# Patient Record
Sex: Female | Born: 1970 | Race: White | Hispanic: No | Marital: Married | Smoking: Never smoker
Health system: Southern US, Community
[De-identification: ages and names within clinical notes are randomized; demographics above are authoritative.]

## PROBLEM LIST (undated history)

## (undated) DIAGNOSIS — D1800 Hemangioma unspecified site: Secondary | ICD-10-CM

## (undated) HISTORY — PX: CHOLECYSTECTOMY: SHX55

## (undated) HISTORY — PX: ADENOIDECTOMY: SUR15

---

## 2009-04-05 ENCOUNTER — Encounter (INDEPENDENT_AMBULATORY_CARE_PROVIDER_SITE_OTHER): Payer: Self-pay | Admitting: Diagnostic Radiology

## 2009-04-05 ENCOUNTER — Ambulatory Visit (HOSPITAL_COMMUNITY): Admission: RE | Admit: 2009-04-05 | Discharge: 2009-04-05 | Payer: Self-pay | Admitting: Internal Medicine

## 2010-07-20 ENCOUNTER — Encounter (INDEPENDENT_AMBULATORY_CARE_PROVIDER_SITE_OTHER): Payer: Self-pay | Admitting: Internal Medicine

## 2010-10-02 LAB — APTT: aPTT: 27 seconds (ref 24–37)

## 2010-10-02 LAB — CBC
HCT: 41.5 % (ref 36.0–46.0)
MCHC: 34.1 g/dL (ref 30.0–36.0)
Platelets: 248 10*3/uL (ref 150–400)
RBC: 4.56 MIL/uL (ref 3.87–5.11)

## 2010-10-02 LAB — PROTIME-INR
INR: 1.04 (ref 0.00–1.49)
Prothrombin Time: 13.5 seconds (ref 11.6–15.2)

## 2013-03-15 ENCOUNTER — Encounter: Payer: Self-pay | Admitting: Family Medicine

## 2013-03-15 ENCOUNTER — Ambulatory Visit (INDEPENDENT_AMBULATORY_CARE_PROVIDER_SITE_OTHER): Payer: BC Managed Care – PPO | Admitting: Family Medicine

## 2013-03-15 VITALS — BP 130/80 | Ht 66.0 in | Wt 251.5 lb

## 2013-03-15 DIAGNOSIS — Z87898 Personal history of other specified conditions: Secondary | ICD-10-CM

## 2013-03-15 DIAGNOSIS — Z0000 Encounter for general adult medical examination without abnormal findings: Secondary | ICD-10-CM

## 2013-03-15 DIAGNOSIS — I10 Essential (primary) hypertension: Secondary | ICD-10-CM

## 2013-03-15 DIAGNOSIS — Z79899 Other long term (current) drug therapy: Secondary | ICD-10-CM

## 2013-03-15 DIAGNOSIS — Z8742 Personal history of other diseases of the female genital tract: Secondary | ICD-10-CM

## 2013-03-15 MED ORDER — ENALAPRIL MALEATE 10 MG PO TABS: 10.0000 mg | ORAL_TABLET | Freq: Every day | ORAL | Status: DC

## 2013-03-15 MED ORDER — NORGESTREL-ETHINYL ESTRADIOL 0.3-30 MG-MCG PO TABS: 1.0000 | ORAL_TABLET | Freq: Every day | ORAL | Status: DC

## 2013-03-15 NOTE — Progress Notes (Signed)
  Subjective:    Patient ID: Dana Hardy, female    DOB: 01-09-1971, 42 y.o.   MRN: [id number]  HPI Patient is here today for her annual wellness exam. She states she has no concerns about her health and is doing very well.   Compliant with bp meds regularly  Walking was great but not so much now, due toheat  Generally not a flu shot person.  No fam hx of colon cancer.   last mammo--never has had one as of yet.   Review of Systems  Constitutional: Negative for activity change, appetite change and fatigue.  HENT: Negative for congestion, rhinorrhea, neck pain and ear discharge.   Eyes: Negative for discharge.  Respiratory: Negative for cough, chest tightness and wheezing.   Cardiovascular: Negative for chest pain.  Gastrointestinal: Negative for vomiting and abdominal pain.  Genitourinary: Negative for frequency and difficulty urinating.  Allergic/Immunologic: Negative for environmental allergies and food allergies.  Neurological: Negative for weakness and headaches.  Psychiatric/Behavioral: Negative for behavioral problems and agitation.       Objective:   Physical Exam  Vitals reviewed. Constitutional: She is oriented to person, place, and time. She appears well-developed and well-nourished.  Of obesity noted  HENT:  Head: Normocephalic.  Right Ear: External ear normal.  Left Ear: External ear normal.  Eyes: Pupils are equal, round, and reactive to light.  Neck: Normal range of motion. No thyromegaly present.  Cardiovascular: Normal rate, regular rhythm, normal heart sounds and intact distal pulses.   No murmur heard. Pulmonary/Chest: Effort normal and breath sounds normal. No respiratory distress. She has no wheezes.  Abdominal: Soft. Bowel sounds are normal. She exhibits no distension and no mass. There is no tenderness.  Musculoskeletal: Normal range of motion. She exhibits no edema and no tenderness.  Lymphadenopathy:    She has no cervical adenopathy.   Neurological: She is alert and oriented to person, place, and time. She exhibits normal muscle tone.  Skin: Skin is warm and dry.  Psychiatric: She has a normal mood and affect. Her behavior is normal.          Assessment & Plan:  Impression wellness exam #2 hypertension good control plan mammogram. Appropriate blood work. Maintain same medications. Maintain same birth control pills. Diet exercise discussed. Check in 6 months. WSL

## 2013-03-16 DIAGNOSIS — I10 Essential (primary) hypertension: Secondary | ICD-10-CM | POA: Insufficient documentation

## 2013-03-16 DIAGNOSIS — Z87898 Personal history of other specified conditions: Secondary | ICD-10-CM | POA: Insufficient documentation

## 2013-03-18 LAB — LIPID PANEL: VLDL: 26 mg/dL (ref 0–40)

## 2013-03-18 LAB — HEPATIC FUNCTION PANEL
AST: 16 U/L (ref 0–37)
Alkaline Phosphatase: 266 U/L — ABNORMAL HIGH (ref 39–117)
Indirect Bilirubin: 0.4 mg/dL (ref 0.0–0.9)
Total Protein: 7 g/dL (ref 6.0–8.3)

## 2013-03-18 LAB — BASIC METABOLIC PANEL
BUN: 12 mg/dL (ref 6–23)
CO2: 27 mEq/L (ref 19–32)
Creat: 0.97 mg/dL (ref 0.50–1.10)
Potassium: 4.2 mEq/L (ref 3.5–5.3)

## 2013-03-20 ENCOUNTER — Other Ambulatory Visit: Payer: Self-pay | Admitting: Family Medicine

## 2013-03-21 ENCOUNTER — Encounter: Payer: Self-pay | Admitting: Family Medicine

## 2013-03-22 ENCOUNTER — Ambulatory Visit (HOSPITAL_COMMUNITY): Payer: Self-pay

## 2013-12-20 ENCOUNTER — Ambulatory Visit (INDEPENDENT_AMBULATORY_CARE_PROVIDER_SITE_OTHER): Payer: PRIVATE HEALTH INSURANCE | Admitting: Family Medicine

## 2013-12-20 ENCOUNTER — Encounter: Payer: Self-pay | Admitting: Family Medicine

## 2013-12-20 VITALS — BP 140/88 | Temp 98.2°F | Ht 66.0 in | Wt 256.5 lb

## 2013-12-20 DIAGNOSIS — I10 Essential (primary) hypertension: Secondary | ICD-10-CM

## 2013-12-20 MED ORDER — ENALAPRIL MALEATE 10 MG PO TABS: 10.0000 mg | ORAL_TABLET | Freq: Every day | ORAL | Status: DC

## 2013-12-20 NOTE — Progress Notes (Signed)
   Subjective:    Patient ID: Dana LouisHeather Salome, female    DOB: [birth year], 43 y.o.   MRN: [id number]  Hypertension This is a chronic problem. The current episode started more than 1 year ago. The problem has been gradually improving since onset. The problem is controlled. There are no associated agents to hypertension. There are no known risk factors for coronary artery disease. Treatments tried: enalapril. The current treatment provides significant improvement. Compliance problems: Patient has been off of med for awhile due to not having insurance.     Patient states she has no concerns at this time.   Sticking with bp meds/however recently ran out  Usually in the morn/   Trying to watch salt in the diet. Watching fats in the diet. Review of Systems No headache no chest pain no back pain no abdominal pain no change in bowel habits no blood in stool ROS otherwise negative.    Objective:   Physical Exam Alert no apparent distress. HEENT normal. Vital stable. BP 142/90. Lungs clear. Heart regular in rhythm.       Assessment & Plan:  Impression 1 hypertension suboptimal in currently with having run out of medication. Plan diet discussed. Exercise discussed. Compliance discussed. Medications refilled. Check in 6 months. WSL

## 2014-04-09 ENCOUNTER — Other Ambulatory Visit: Payer: Self-pay | Admitting: Family Medicine

## 2014-06-16 ENCOUNTER — Other Ambulatory Visit: Payer: Self-pay | Admitting: Family Medicine

## 2014-09-27 ENCOUNTER — Other Ambulatory Visit: Payer: Self-pay | Admitting: Family Medicine

## 2014-11-07 ENCOUNTER — Ambulatory Visit (INDEPENDENT_AMBULATORY_CARE_PROVIDER_SITE_OTHER): Payer: 59 | Admitting: Family Medicine

## 2014-11-07 ENCOUNTER — Encounter: Payer: Self-pay | Admitting: Family Medicine

## 2014-11-07 VITALS — BP 148/94 | Ht 66.0 in | Wt 248.0 lb

## 2014-11-07 DIAGNOSIS — I10 Essential (primary) hypertension: Secondary | ICD-10-CM | POA: Diagnosis not present

## 2014-11-07 DIAGNOSIS — Z79899 Other long term (current) drug therapy: Secondary | ICD-10-CM | POA: Diagnosis not present

## 2014-11-07 MED ORDER — ENALAPRIL MALEATE 20 MG PO TABS: 20.0000 mg | ORAL_TABLET | Freq: Every day | ORAL | Status: DC

## 2014-11-07 NOTE — Progress Notes (Signed)
   Subjective:    Patient ID: Dana Hardy, female    DOB: [birth year], 44 y.o.   MRN: [id number]  Hypertension This is a chronic problem. The current episode started more than 1 year ago. The problem has been gradually improving since onset. There are no associated agents to hypertension. There are no known risk factors for coronary artery disease. Treatments tried: Vasotec. The current treatment provides moderate improvement. There are no compliance problems.    Cough achey felt poorly. Slowly improving.   Works a lot physically a child but no regular exercise. Next  Mostly good compliance with diet.   Good compliance with medications      Review of Systems  no headache no chest pain no back pain no abdominal pain    Objective:   Physical Exam  alert mild malaise H&T mom his congestion. Normal lungs slight bronchial cough heart rare rhythm blood pressure  Still elevated on repeat       Assessment & Plan:   impression 1 hypertension unsatisfactory control plan increase enalapril to 20 mg daily. Diet exercise discussed. Strongly encouraged screening physical the summer appropriate blood work. WSL

## 2014-11-09 ENCOUNTER — Other Ambulatory Visit: Payer: Self-pay | Admitting: Family Medicine

## 2014-11-11 ENCOUNTER — Encounter: Payer: Self-pay | Admitting: Family Medicine

## 2014-11-11 LAB — HEPATIC FUNCTION PANEL
ALBUMIN: 3.8 g/dL (ref 3.5–5.5)
ALK PHOS: 358 IU/L — AB (ref 39–117)
ALT: 30 IU/L (ref 0–32)
AST: 24 IU/L (ref 0–40)
Bilirubin Total: 0.3 mg/dL (ref 0.0–1.2)
Bilirubin, Direct: 0.11 mg/dL (ref 0.00–0.40)
TOTAL PROTEIN: 6.9 g/dL (ref 6.0–8.5)

## 2014-11-11 LAB — LIPID PANEL
CHOL/HDL RATIO: 3.6 ratio (ref 0.0–4.4)
CHOLESTEROL TOTAL: 178 mg/dL (ref 100–199)
HDL: 49 mg/dL (ref >39)
LDL CALC: 91 mg/dL (ref 0–99)
Triglycerides: 190 mg/dL — ABNORMAL HIGH (ref 0–149)
VLDL CHOLESTEROL CAL: 38 mg/dL (ref 5–40)

## 2014-11-11 LAB — BASIC METABOLIC PANEL
BUN / CREAT RATIO: 16 (ref 9–23)
BUN: 14 mg/dL (ref 6–24)
CALCIUM: 8.8 mg/dL (ref 8.7–10.2)
CHLORIDE: 98 mmol/L (ref 97–108)
CO2: 24 mmol/L (ref 18–29)
Creatinine, Ser: 0.9 mg/dL (ref 0.57–1.00)
GFR, EST AFRICAN AMERICAN: 91 mL/min/{1.73_m2} (ref >59)
GFR, EST NON AFRICAN AMERICAN: 79 mL/min/{1.73_m2} (ref >59)
GLUCOSE: 79 mg/dL (ref 65–99)
POTASSIUM: 4.3 mmol/L (ref 3.5–5.2)
Sodium: 139 mmol/L (ref 134–144)

## 2014-12-12 ENCOUNTER — Ambulatory Visit (INDEPENDENT_AMBULATORY_CARE_PROVIDER_SITE_OTHER): Payer: 59 | Admitting: Nurse Practitioner

## 2014-12-12 ENCOUNTER — Encounter: Payer: Self-pay | Admitting: Nurse Practitioner

## 2014-12-12 VITALS — BP 130/90 | Ht 67.0 in | Wt 254.0 lb

## 2014-12-12 DIAGNOSIS — Z1231 Encounter for screening mammogram for malignant neoplasm of breast: Secondary | ICD-10-CM | POA: Diagnosis not present

## 2014-12-12 DIAGNOSIS — Z0000 Encounter for general adult medical examination without abnormal findings: Secondary | ICD-10-CM

## 2014-12-12 DIAGNOSIS — Z01419 Encounter for gynecological examination (general) (routine) without abnormal findings: Secondary | ICD-10-CM

## 2014-12-12 DIAGNOSIS — L578 Other skin changes due to chronic exposure to nonionizing radiation: Secondary | ICD-10-CM

## 2014-12-14 ENCOUNTER — Encounter: Payer: Self-pay | Admitting: Nurse Practitioner

## 2014-12-14 DIAGNOSIS — L578 Other skin changes due to chronic exposure to nonionizing radiation: Secondary | ICD-10-CM | POA: Insufficient documentation

## 2014-12-14 NOTE — Progress Notes (Signed)
   Subjective:    Patient ID: Cathren Vandoorn, female    DOB: Jan 22, 1971, 44 y.o.   MRN: [id number]  HPI presents for her wellness physical. Denies any missed pills. Regular menses, normal flow. Same sexual partner. Regular vision exams. Needs dental exam. Very active lifestyle. Overall healthy diet. Routine lab work has been done see note 11/10/2014.    Review of Systems  Constitutional: Negative for fever, activity change, appetite change and fatigue.  HENT: Positive for dental problem. Negative for ear pain, sinus pressure and sore throat.   Respiratory: Negative for cough, chest tightness, shortness of breath and wheezing.   Cardiovascular: Negative for chest pain.  Gastrointestinal: Negative for nausea, vomiting, abdominal pain, diarrhea, constipation and abdominal distention.  Genitourinary: Negative for dysuria, urgency, frequency, vaginal discharge, enuresis, difficulty urinating, genital sores, menstrual problem and pelvic pain.       Objective:   Physical Exam  Constitutional: She is oriented to person, place, and time. She appears well-developed. No distress.  HENT:  Right Ear: External ear normal.  Left Ear: External ear normal.  Mouth/Throat: Oropharynx is clear and moist.  Neck: Normal range of motion. Neck supple. No tracheal deviation present. No thyromegaly present.  Cardiovascular: Normal rate, regular rhythm and normal heart sounds.  Exam reveals no gallop.   No murmur heard. Pulmonary/Chest: Effort normal and breath sounds normal.  Abdominal: Soft. She exhibits no distension. There is no tenderness.  Genitourinary: Vagina normal and uterus normal. No vaginal discharge found.  External GU no rashes or lesions. Vagina no discharge. Cervix normal limit in appearance, no CMT. Bimanual exam no tenderness or obvious masses.  Musculoskeletal: She exhibits no edema.  Lymphadenopathy:    She has no cervical adenopathy.  Neurological: She is alert and oriented to person,  place, and time.  Skin: Skin is warm and dry. No rash noted.  Significant sun damage noted on sun exposed areas.  Psychiatric: She has a normal mood and affect. Her behavior is normal.  Vitals reviewed.  Breast exam: Areas of density, minimal fine nodularity, no masses. Axilla no adenopathy.        Assessment & Plan:   Problem List Items Addressed This Visit      Musculoskeletal and Integument   Sun-damaged skin    Other Visit Diagnoses    Well woman exam    -  Primary    Relevant Orders    MM DIGITAL SCREENING BILATERAL    Visit for screening mammogram        Relevant Orders    MM DIGITAL SCREENING BILATERAL      Recommend continued weight loss efforts. Daily vitamin D and calcium supplementation. Patient has seen a dermatologist in the past for skin cancer screening and removal of several lesions. Strongly recommend she reconsider a yearly skin cancer screening. Also wear sunscreen on a regular basis. Wishes to continue birth control pills for now. Nonsmoker. Return in about 1 year (around 12/12/2015) for physical.

## 2014-12-20 ENCOUNTER — Ambulatory Visit (HOSPITAL_COMMUNITY)
Admission: RE | Admit: 2014-12-20 | Discharge: 2014-12-20 | Disposition: A | Discharge status: Home / Self Care | Payer: 59 | Source: Ambulatory Visit | Attending: Nurse Practitioner | Admitting: Nurse Practitioner

## 2014-12-20 ENCOUNTER — Ambulatory Visit (HOSPITAL_COMMUNITY): Admission: RE | Admit: 2014-12-20 | Payer: 59 | Source: Ambulatory Visit

## 2014-12-20 ENCOUNTER — Other Ambulatory Visit: Payer: Self-pay | Admitting: Nurse Practitioner

## 2014-12-20 DIAGNOSIS — Z1231 Encounter for screening mammogram for malignant neoplasm of breast: Secondary | ICD-10-CM

## 2014-12-31 ENCOUNTER — Other Ambulatory Visit: Payer: Self-pay | Admitting: Family Medicine

## 2015-02-04 ENCOUNTER — Telehealth: Payer: Self-pay | Admitting: Family Medicine

## 2015-02-04 ENCOUNTER — Other Ambulatory Visit: Payer: Self-pay | Admitting: Family Medicine

## 2015-02-04 MED ORDER — NORGESTREL-ETHINYL ESTRADIOL 0.3-30 MG-MCG PO TABS: 1.0000 | ORAL_TABLET | Freq: Every day | ORAL | Status: DC

## 2015-02-04 NOTE — Telephone Encounter (Signed)
Notified patient's husband that med was sent to pharmacy.

## 2015-02-04 NOTE — Telephone Encounter (Signed)
Pt is needing a refill on her birth control meds.     [facility]

## 2015-04-27 ENCOUNTER — Other Ambulatory Visit: Payer: Self-pay | Admitting: Family Medicine

## 2015-05-08 ENCOUNTER — Encounter: Payer: Self-pay | Admitting: Family Medicine

## 2015-05-08 ENCOUNTER — Ambulatory Visit (INDEPENDENT_AMBULATORY_CARE_PROVIDER_SITE_OTHER): Payer: 59 | Admitting: Family Medicine

## 2015-05-08 VITALS — BP 136/88 | Ht 66.0 in | Wt 255.4 lb

## 2015-05-08 DIAGNOSIS — I10 Essential (primary) hypertension: Secondary | ICD-10-CM | POA: Diagnosis not present

## 2015-05-08 MED ORDER — ENALAPRIL MALEATE 20 MG PO TABS: 20.0000 mg | ORAL_TABLET | Freq: Every day | ORAL | Status: DC

## 2015-05-08 NOTE — Progress Notes (Signed)
   Subjective:    Patient ID: Robbie LouisHeather Eisenhart, female    DOB: 08/17/1970, 44 y.o.   MRN: [id number]  Hypertension This is a chronic problem. The current episode started more than 1 year ago. There are no compliance problems.      Sticking with bp med faithfully, feeling better over all   Staying physically active  Working pretty  Wide open  Patient states no other concerns this visit.  Review of Systems No headache no chest pain no back pain no abdominal pain ROS otherwise negative    Objective:   Physical Exam Alert vitals stable HEENT normal lungs clear heart regular in rhythm blood pressure good on repeat       Assessment & Plan:  Impression hypertension good control, medications reviewed plan maintain same meds diet exercise discussed recheck in 6 months WSL

## 2015-05-24 ENCOUNTER — Emergency Department (HOSPITAL_BASED_OUTPATIENT_CLINIC_OR_DEPARTMENT_OTHER)
Admission: EM | Admit: 2015-05-24 | Discharge: 2015-05-24 | Disposition: A | Discharge status: Home / Self Care | Payer: 59 | Attending: Emergency Medicine | Admitting: Emergency Medicine

## 2015-05-24 ENCOUNTER — Encounter (HOSPITAL_BASED_OUTPATIENT_CLINIC_OR_DEPARTMENT_OTHER): Payer: Self-pay

## 2015-05-24 DIAGNOSIS — N898 Other specified noninflammatory disorders of vagina: Secondary | ICD-10-CM | POA: Insufficient documentation

## 2015-05-24 DIAGNOSIS — Z3202 Encounter for pregnancy test, result negative: Secondary | ICD-10-CM | POA: Insufficient documentation

## 2015-05-24 DIAGNOSIS — Z79899 Other long term (current) drug therapy: Secondary | ICD-10-CM | POA: Diagnosis not present

## 2015-05-24 DIAGNOSIS — R1031 Right lower quadrant pain: Secondary | ICD-10-CM | POA: Insufficient documentation

## 2015-05-24 DIAGNOSIS — Z792 Long term (current) use of antibiotics: Secondary | ICD-10-CM | POA: Insufficient documentation

## 2015-05-24 DIAGNOSIS — R109 Unspecified abdominal pain: Secondary | ICD-10-CM

## 2015-05-24 LAB — COMPREHENSIVE METABOLIC PANEL
ALBUMIN: 3.5 g/dL (ref 3.5–5.0)
ALT: 25 U/L (ref 14–54)
ANION GAP: 9 (ref 5–15)
AST: 26 U/L (ref 15–41)
Alkaline Phosphatase: 315 U/L — ABNORMAL HIGH (ref 38–126)
BUN: 13 mg/dL (ref 6–20)
CO2: 27 mmol/L (ref 22–32)
Calcium: 9 mg/dL (ref 8.9–10.3)
Chloride: 102 mmol/L (ref 101–111)
Creatinine, Ser: 0.84 mg/dL (ref 0.44–1.00)
GFR calc non Af Amer: >60 mL/min (ref >60)
GLUCOSE: 126 mg/dL — AB (ref 65–99)
POTASSIUM: 3.7 mmol/L (ref 3.5–5.1)
SODIUM: 138 mmol/L (ref 135–145)
Total Bilirubin: 0.4 mg/dL (ref 0.3–1.2)
Total Protein: 7.5 g/dL (ref 6.5–8.1)

## 2015-05-24 LAB — URINALYSIS, ROUTINE W REFLEX MICROSCOPIC
BILIRUBIN URINE: NEGATIVE
GLUCOSE, UA: NEGATIVE mg/dL
KETONES UR: NEGATIVE mg/dL
LEUKOCYTES UA: NEGATIVE
Nitrite: NEGATIVE
PROTEIN: 30 mg/dL — AB
Specific Gravity, Urine: 1.022 (ref 1.005–1.030)
pH: 7.5 (ref 5.0–8.0)

## 2015-05-24 LAB — WET PREP, GENITAL
Clue Cells Wet Prep HPF POC: NONE SEEN
SPERM: NONE SEEN
Trich, Wet Prep: NONE SEEN
YEAST WET PREP: NONE SEEN

## 2015-05-24 LAB — CBC WITH DIFFERENTIAL/PLATELET
BASOS PCT: 1 %
Basophils Absolute: 0 10*3/uL (ref 0.0–0.1)
EOS ABS: 0 10*3/uL (ref 0.0–0.7)
EOS PCT: 1 %
HCT: 42.2 % (ref 36.0–46.0)
HEMOGLOBIN: 13.9 g/dL (ref 12.0–15.0)
Lymphocytes Relative: 15 %
Lymphs Abs: 1 10*3/uL (ref 0.7–4.0)
MCH: 29.6 pg (ref 26.0–34.0)
MCHC: 32.9 g/dL (ref 30.0–36.0)
MCV: 90 fL (ref 78.0–100.0)
MONO ABS: 0.4 10*3/uL (ref 0.1–1.0)
MONOS PCT: 6 %
NEUTROS PCT: 77 %
Neutro Abs: 5.1 10*3/uL (ref 1.7–7.7)
PLATELETS: 249 10*3/uL (ref 150–400)
RBC: 4.69 MIL/uL (ref 3.87–5.11)
RDW: 13 % (ref 11.5–15.5)
WBC: 6.6 10*3/uL (ref 4.0–10.5)

## 2015-05-24 LAB — URINE MICROSCOPIC-ADD ON: WBC, UA: NONE SEEN WBC/hpf (ref 0–5)

## 2015-05-24 LAB — PREGNANCY, URINE: Preg Test, Ur: NEGATIVE

## 2015-05-24 NOTE — ED Provider Notes (Signed)
CSN: [id number]     Arrival date & time 05/24/15  1204 History   First MD Initiated Contact with Patient 05/24/15 1230     Chief Complaint  Patient presents with  . Abdominal Pain     (Consider location/radiation/quality/duration/timing/severity/associated sxs/prior Treatment) HPI Comments: Patient presents with acute onset of right lower quadrant abdominal pain starting approximately one hour prior to arrival. Patient describes the pain as cramping and severe. It has gradually improved since onset. Pain is gone from a 10/10 to a 2/10. No associated vomiting, fever, diarrhea. Pain did not radiate. She has not had pain like this in the past. Patient notes that she started her menstrual period this morning. No history of ovarian problems. Only abdominal surgery was cholecystectomy. No history of kidney stones. No urinary symptoms including dysuria or hematuria. Kidney stones to monitor family however. Onset of symptoms acute. Course is improving. Nothing makes symptoms better or worse. No treatments prior to arrival.  Patient is a 44 y.o. female presenting with abdominal pain. The history is provided by the patient.  Abdominal Pain Associated symptoms: vaginal bleeding   Associated symptoms: no chest pain, no cough, no diarrhea, no dysuria, no fever, no hematuria, no nausea, no sore throat, no vaginal discharge and no vomiting     History reviewed. No pertinent past medical history. History reviewed. No pertinent past surgical history. No family history on file. Social History  Substance Use Topics  . Smoking status: Never Smoker   . Smokeless tobacco: None  . Alcohol Use: No   OB History    No data available     Review of Systems  Constitutional: Negative for fever.  HENT: Negative for rhinorrhea and sore throat.   Eyes: Negative for redness.  Respiratory: Negative for cough.   Cardiovascular: Negative for chest pain.  Gastrointestinal: Positive for abdominal pain. Negative for  nausea, vomiting and diarrhea.  Genitourinary: Positive for vaginal bleeding. Negative for dysuria, urgency, hematuria and vaginal discharge.  Musculoskeletal: Negative for myalgias.  Skin: Negative for rash.  Neurological: Negative for headaches.    Allergies  Review of patient's allergies indicates no known allergies.  Home Medications   Prior to Admission medications   Medication Sig Start Date End Date Taking? Authorizing Provider  enalapril (VASOTEC) 20 MG tablet Take 1 tablet (20 mg total) by mouth daily. 05/08/15   Merlyn AlbertWilliam S Luking, MD  norgestrel-ethinyl estradiol (CRYSELLE-28) 0.3-30 MG-MCG tablet Take 1 tablet by mouth daily. 02/04/15   Merlyn AlbertWilliam S Luking, MD   BP 159/97 mmHg  Pulse 63  Temp(Src) 97.6 F (36.4 C) (Oral)  Resp 20  Ht 5\' 7"  (1.702 m)  Wt 113.399 kg  BMI 39.15 kg/m2  LMP 05/24/2015   Physical Exam  Constitutional: She appears well-developed and well-nourished.  HENT:  Head: Normocephalic and atraumatic.  Eyes: Conjunctivae are normal. Right eye exhibits no discharge. Left eye exhibits no discharge.  Neck: Normal range of motion. Neck supple.  Cardiovascular: Normal rate, regular rhythm and normal heart sounds.   Pulmonary/Chest: Effort normal and breath sounds normal.  Abdominal: Soft. Bowel sounds are normal. There is tenderness (mild to moderate tenderness right lateral abdomen and right lower abdomen.). There is no rebound and no guarding.  Genitourinary: There is no rash or tenderness on the right labia. There is no rash or tenderness on the left labia. Uterus is not tender. Cervix exhibits discharge (bleeding). Cervix exhibits no motion tenderness. Right adnexum displays no mass and no tenderness. Left adnexum displays no mass and  no tenderness. There is bleeding in the vagina. No vaginal discharge found.  No adnexal tenderness  Neurological: She is alert.  Skin: Skin is warm and dry.  Psychiatric: She has a normal mood and affect.  Nursing note and  vitals reviewed.   ED Course  Procedures (including critical care time) Labs Review Labs Reviewed  WET PREP, GENITAL - Abnormal; Notable for the following:    WBC, Wet Prep HPF POC MANY (*)    All other components within normal limits  URINALYSIS, ROUTINE W REFLEX MICROSCOPIC (NOT AT [facility]) - Abnormal; Notable for the following:    Hgb urine dipstick MODERATE (*)    Protein, ur 30 (*)    All other components within normal limits  COMPREHENSIVE METABOLIC PANEL - Abnormal; Notable for the following:    Glucose, Bld 126 (*)    Alkaline Phosphatase 315 (*)    All other components within normal limits  URINE MICROSCOPIC-ADD ON - Abnormal; Notable for the following:    Squamous Epithelial / LPF 0-5 (*)    Bacteria, UA RARE (*)    All other components within normal limits  PREGNANCY, URINE  CBC WITH DIFFERENTIAL/PLATELET  GC/CHLAMYDIA PROBE AMP ([place]) NOT AT [facility]    Imaging Review No results found. I have personally reviewed and evaluated these images and lab results as part of my medical decision-making.   EKG Interpretation None       12:42 PM Patient seen and examined. Work-up initiated. Medications ordered.   Vital signs reviewed and are as follows: BP 159/97 mmHg  Pulse 63  Temp(Src) 97.6 F (36.4 C) (Oral)  Resp 20  Ht  (1.702 m)  Wt 113.399 kg  BMI 39.15 kg/m2  LMP 05/24/2015  Pelvic exam performed with nurse chaperone Camelia Eng).   2:09 PM patient updated on results including elevated alkaline phosphatase admits to be monitored.  Offered CT versus observation given that symptoms are improved in that labs are reassuring. Patient request discharge to home. We discussed that she needs to seek immediate medical attention with return if severe pain, fever, vomiting, blood in stool, or other concerns. Patient verbalizes understanding and agrees with this plan. Encouraged PCP follow-up in next several days for recheck. Patient given strainer, as kidney stone is  on differential.  MDM   Final diagnoses:  Abdominal pain, unspecified abdominal location   Patient with acute onset of right lower quadrant pain, severe, followed by gradual improvement and near resolution of symptoms. No other symptoms reported. Patient does have some mild tenderness. Labs are reassuring. On pelvic exam, no significant pelvic pain to necessitate pelvic ultrasound. Urine has blood, however this could potentially be contaminant. Kidney stone, likely now pased, is on differential. Normal kidney function. Patient has chronically elevated alkaline phosphatase which she was informed of today. She will have this followed with her PCP. Patient wishes to be discharged. Strict return instructions as above. She seems reliable to return with worsening.    Renne Crigler, PA-C 05/24/15 1413  Melene Plan, DO 05/24/15 1600

## 2015-05-24 NOTE — Discharge Instructions (Signed)
Please read and follow all provided instructions.  Your diagnoses today include:  1. Abdominal pain, unspecified abdominal location     Tests performed today include:  Blood counts and electrolytes  Blood tests to check liver and kidney function - mild, chronic elevation in Alkaline phosphatase level, should be rechecked by your doctor  Urine test to look for infection and pregnancy (in women)  Wet prep: No vaginal infection  Vital signs. See below for your results today.   Medications prescribed:   None  Take any prescribed medications only as directed.  Home care instructions:   Follow any educational materials contained in this packet.  Follow-up instructions: Please follow-up with your primary care provider in the next 3 days for further evaluation of your symptoms.    Return instructions:  SEEK IMMEDIATE MEDICAL ATTENTION IF:  The pain does not go away or becomes severe   A temperature above 101F develops   Repeated vomiting occurs (multiple episodes)   The pain becomes localized to portions of the abdomen. The right side could possibly be appendicitis. In an adult, the left lower portion of the abdomen could be colitis or diverticulitis.   Blood is being passed in stools or vomit (bright red or black tarry stools)   You develop chest pain, difficulty breathing, dizziness or fainting, or become confused, poorly responsive, or inconsolable (young children)  If you have any other emergent concerns regarding your health  Additional Information: Abdominal (belly) pain can be caused by many things. Your caregiver performed an examination and possibly ordered blood/urine tests and imaging (CT scan, x-rays, ultrasound). Many cases can be observed and treated at home after initial evaluation in the emergency department. Even though you are being discharged home, abdominal pain can be unpredictable. Therefore, you need a repeated exam if your pain does not resolve,  returns, or worsens. Most patients with abdominal pain don't have to be admitted to the hospital or have surgery, but serious problems like appendicitis and gallbladder attacks can start out as nonspecific pain. Many abdominal conditions cannot be diagnosed in one visit, so follow-up evaluations are very important.  Your vital signs today were: BP 159/97 mmHg   Pulse 63   Temp(Src) 97.6 F (36.4 C) (Oral)   Resp 20   Ht 5\' 7"  (1.702 m)   Wt 113.399 kg   BMI 39.15 kg/m2   LMP 05/24/2015 If your blood pressure (bp) was elevated above 135/85 this visit, please have this repeated by your doctor within one month. --------------

## 2015-05-24 NOTE — ED Notes (Signed)
RLQ pain x 1hour-period started today

## 2015-05-27 LAB — GC/CHLAMYDIA PROBE AMP (~~LOC~~) NOT AT ARMC
CHLAMYDIA, DNA PROBE: NEGATIVE
NEISSERIA GONORRHEA: NEGATIVE

## 2015-07-17 ENCOUNTER — Other Ambulatory Visit: Payer: Self-pay | Admitting: Family Medicine

## 2016-01-01 ENCOUNTER — Other Ambulatory Visit: Payer: Self-pay | Admitting: Family Medicine

## 2016-02-03 ENCOUNTER — Other Ambulatory Visit: Payer: Self-pay | Admitting: *Deleted

## 2016-02-03 ENCOUNTER — Other Ambulatory Visit: Payer: Self-pay | Admitting: Family Medicine

## 2016-02-03 ENCOUNTER — Telehealth: Payer: Self-pay | Admitting: Nurse Practitioner

## 2016-02-03 MED ORDER — NORGESTREL-ETHINYL ESTRADIOL 0.3-30 MG-MCG PO TABS: 1.0000 | ORAL_TABLET | Freq: Every day | ORAL | 0 refills | Status: DC

## 2016-02-03 NOTE — Telephone Encounter (Signed)
Refill sent to pharm. Pt notified  

## 2016-02-03 NOTE — Telephone Encounter (Signed)
Dana Hardy-28 0.3-30 MG-MCG tablet  [name]   Pt wasn't aware she needed an appt, states her med last month did not reflect that.   Made appt for next month but she needs a refill for one month on this please

## 2016-03-04 ENCOUNTER — Other Ambulatory Visit: Payer: Self-pay | Admitting: *Deleted

## 2016-03-04 ENCOUNTER — Telehealth: Payer: Self-pay | Admitting: Family Medicine

## 2016-03-04 MED ORDER — NORGESTREL-ETHINYL ESTRADIOL 0.3-30 MG-MCG PO TABS: 1.0000 | ORAL_TABLET | Freq: Every day | ORAL | 0 refills | Status: DC

## 2016-03-04 NOTE — Telephone Encounter (Signed)
One month supply sent to pharm. Pt notified.

## 2016-03-04 NOTE — Telephone Encounter (Signed)
Pt is requesting a refill on her birth control medication to last her to her appt.    [facility]

## 2016-03-11 ENCOUNTER — Ambulatory Visit (INDEPENDENT_AMBULATORY_CARE_PROVIDER_SITE_OTHER): Payer: BLUE CROSS/BLUE SHIELD | Admitting: Nurse Practitioner

## 2016-03-11 ENCOUNTER — Encounter: Payer: Self-pay | Admitting: Nurse Practitioner

## 2016-03-11 VITALS — BP 128/82 | Ht 66.0 in | Wt 268.0 lb

## 2016-03-11 DIAGNOSIS — I10 Essential (primary) hypertension: Secondary | ICD-10-CM

## 2016-03-11 DIAGNOSIS — T07XXXA Unspecified multiple injuries, initial encounter: Secondary | ICD-10-CM

## 2016-03-11 DIAGNOSIS — R5383 Other fatigue: Secondary | ICD-10-CM

## 2016-03-11 DIAGNOSIS — T148 Other injury of unspecified body region: Secondary | ICD-10-CM

## 2016-03-11 DIAGNOSIS — Z124 Encounter for screening for malignant neoplasm of cervix: Secondary | ICD-10-CM | POA: Diagnosis not present

## 2016-03-11 DIAGNOSIS — Z0000 Encounter for general adult medical examination without abnormal findings: Secondary | ICD-10-CM | POA: Diagnosis not present

## 2016-03-11 DIAGNOSIS — Z1151 Encounter for screening for human papillomavirus (HPV): Secondary | ICD-10-CM

## 2016-03-11 DIAGNOSIS — Z01419 Encounter for gynecological examination (general) (routine) without abnormal findings: Secondary | ICD-10-CM

## 2016-03-11 DIAGNOSIS — Z23 Encounter for immunization: Secondary | ICD-10-CM

## 2016-03-12 ENCOUNTER — Encounter: Payer: Self-pay | Admitting: Nurse Practitioner

## 2016-03-12 NOTE — Progress Notes (Signed)
Subjective:    Patient ID: Dana Hardy, female    DOB: 07-28-1970, 45 y.o.   MRN: [id number]  HPI presents for her wellness exam. Having regular cycles light flow, same sexual partner. Takes a daily multivitamin, drinks a lot of milk daily. Regular eye exams. Needs a dental exam. Works in Aeronautical engineerlandscaping and oral form, tetanus status unknown.    Review of Systems  Constitutional: Negative for activity change, appetite change and fatigue.  HENT: Negative for dental problem, ear pain, sinus pressure and sore throat.   Respiratory: Negative for cough, chest tightness, shortness of breath and wheezing.   Cardiovascular: Negative for chest pain.  Gastrointestinal: Negative for abdominal distention, abdominal pain, constipation, diarrhea, nausea and vomiting.  Genitourinary: Negative for difficulty urinating, dysuria, enuresis, frequency, genital sores, menstrual problem, pelvic pain, urgency and vaginal discharge.  Skin:       Small lesion on the right side of the nose that is been there long-term, slight change. No bleeding. Also has multiple superficial abrasions especially on the extremities.       Objective:   Physical Exam  Constitutional: She is oriented to person, place, and time. She appears well-developed. No distress.  HENT:  Right Ear: External ear normal.  Left Ear: External ear normal.  Mouth/Throat: Oropharynx is clear and moist.  Neck: Normal range of motion. Neck supple. No tracheal deviation present. No thyromegaly present.  Cardiovascular: Normal rate, regular rhythm and normal heart sounds.  Exam reveals no gallop.   No murmur heard. Pulmonary/Chest: Effort normal and breath sounds normal.  Abdominal: Soft. She exhibits no distension. There is no tenderness.  Genitourinary: Vagina normal and uterus normal. No vaginal discharge found.  Genitourinary Comments: External GU no rashes or lesions. Vagina no discharge. Cervix normal limit in appearance. No CMT. Bimanual  exam no tenderness or obvious masses.  Musculoskeletal: She exhibits no edema.  Lymphadenopathy:    She has no cervical adenopathy.  Neurological: She is alert and oriented to person, place, and time.  Skin: Skin is warm and dry. No rash noted.  Slightly raised pink lesion noted on the right side of the nose near the nostril. Multiple superficial abrasions noted on the extremities. Significant sun damage noted.  Psychiatric: She has a normal mood and affect. Her behavior is normal.  Vitals reviewed.  Breast exam: Areas of dense tissue, no masses noted. Axilla no adenopathy.        Assessment & Plan:   Problem List Items Addressed This Visit    None    Visit Diagnoses    Well woman exam    -  Primary   Relevant Orders   Pap IG and HPV (high risk) DNA detection   Lipid panel   Screening for cervical cancer       Relevant Orders   Pap IG and HPV (high risk) DNA detection   Screening for HPV (human papillomavirus)       Relevant Orders   Pap IG and HPV (high risk) DNA detection   Abrasions of multiple sites       Relevant Orders   Td vaccine greater than or equal to 7yo preservative free IM (Completed)   Essential hypertension       Relevant Orders   Hepatic function panel   Basic metabolic panel   Other fatigue       Relevant Orders   TSH     Tetanus vaccine today due to abrasions and high risk occupation. Patient given information so she  can schedule an appointment with dermatology for the lesion on her nose and for skin cancer screening. Patient also likes to schedule her own mammogram. Encouraged healthy diet and weight loss. Return in about 6 months (around 09/08/2016) for BP check up.

## 2016-03-13 LAB — PAP IG AND HPV HIGH-RISK
HPV, HIGH-RISK: NEGATIVE
PAP Smear Comment: 0

## 2016-03-14 DIAGNOSIS — Z0000 Encounter for general adult medical examination without abnormal findings: Secondary | ICD-10-CM | POA: Diagnosis not present

## 2016-03-14 DIAGNOSIS — R5383 Other fatigue: Secondary | ICD-10-CM | POA: Diagnosis not present

## 2016-03-14 DIAGNOSIS — I10 Essential (primary) hypertension: Secondary | ICD-10-CM | POA: Diagnosis not present

## 2016-03-15 LAB — BASIC METABOLIC PANEL
BUN/Creatinine Ratio: 15 (ref 9–23)
BUN: 14 mg/dL (ref 6–24)
CHLORIDE: 98 mmol/L (ref 96–106)
CO2: 25 mmol/L (ref 18–29)
Calcium: 9.2 mg/dL (ref 8.7–10.2)
Creatinine, Ser: 0.92 mg/dL (ref 0.57–1.00)
GFR calc Af Amer: 88 mL/min/{1.73_m2} (ref >59)
GFR calc non Af Amer: 76 mL/min/{1.73_m2} (ref >59)
GLUCOSE: 81 mg/dL (ref 65–99)
Potassium: 4.6 mmol/L (ref 3.5–5.2)
SODIUM: 140 mmol/L (ref 134–144)

## 2016-03-15 LAB — HEPATIC FUNCTION PANEL
ALK PHOS: 373 IU/L — AB (ref 39–117)
ALT: 20 IU/L (ref 0–32)
AST: 20 IU/L (ref 0–40)
Albumin: 3.9 g/dL (ref 3.5–5.5)
BILIRUBIN, DIRECT: 0.07 mg/dL (ref 0.00–0.40)
Bilirubin Total: 0.3 mg/dL (ref 0.0–1.2)
Total Protein: 6.8 g/dL (ref 6.0–8.5)

## 2016-03-15 LAB — TSH: TSH: 4.61 u[IU]/mL — AB (ref 0.450–4.500)

## 2016-03-15 LAB — LIPID PANEL
CHOL/HDL RATIO: 3.8 ratio (ref 0.0–4.4)
Cholesterol, Total: 188 mg/dL (ref 100–199)
HDL: 50 mg/dL (ref >39)
LDL Calculated: 102 mg/dL — ABNORMAL HIGH (ref 0–99)
TRIGLYCERIDES: 178 mg/dL — AB (ref 0–149)
VLDL Cholesterol Cal: 36 mg/dL (ref 5–40)

## 2016-04-03 ENCOUNTER — Other Ambulatory Visit: Payer: Self-pay | Admitting: Family Medicine

## 2016-04-06 MED ORDER — NORGESTREL-ETHINYL ESTRADIOL 0.3-30 MG-MCG PO TABS: ORAL_TABLET | ORAL | 5 refills | Status: DC

## 2016-04-06 NOTE — Addendum Note (Signed)
Addended by: Dereck LigasJOHNSON, Karl Knarr P on: 04/06/2016 12:02 PM   Modules accepted: Orders

## 2016-04-28 ENCOUNTER — Other Ambulatory Visit: Payer: Self-pay | Admitting: Family Medicine

## 2016-09-16 ENCOUNTER — Ambulatory Visit (INDEPENDENT_AMBULATORY_CARE_PROVIDER_SITE_OTHER): Payer: BLUE CROSS/BLUE SHIELD | Admitting: Family Medicine

## 2016-09-16 ENCOUNTER — Encounter: Payer: Self-pay | Admitting: Family Medicine

## 2016-09-16 VITALS — BP 160/98 | Temp 97.6°F | Ht 66.0 in | Wt 262.0 lb

## 2016-09-16 DIAGNOSIS — I10 Essential (primary) hypertension: Secondary | ICD-10-CM

## 2016-09-16 DIAGNOSIS — J329 Chronic sinusitis, unspecified: Secondary | ICD-10-CM | POA: Diagnosis not present

## 2016-09-16 DIAGNOSIS — J310 Chronic rhinitis: Secondary | ICD-10-CM

## 2016-09-16 MED ORDER — AMOXICILLIN 500 MG PO CAPS: 500.0000 mg | ORAL_CAPSULE | Freq: Three times a day (TID) | ORAL | 0 refills | Status: DC

## 2016-09-16 MED ORDER — NORGESTREL-ETHINYL ESTRADIOL 0.3-30 MG-MCG PO TABS: ORAL_TABLET | ORAL | 5 refills | Status: DC

## 2016-09-16 MED ORDER — ENALAPRIL MALEATE 20 MG PO TABS: 20.0000 mg | ORAL_TABLET | Freq: Every day | ORAL | 5 refills | Status: DC

## 2016-09-16 NOTE — Progress Notes (Signed)
   Subjective:    Patient ID: Dana Hardy, female    DOB: 1971/04/17, 46 y.o.   MRN: [id number]  Hypertension  This is a chronic problem. Treatments tried: enalapril 20mg . Compliance problems include diet (could do better with diet).    Needs refills on enalapril and cryselle-28. Physical was done sept 2017.   Blood pressure medicine and blood pressure levels reviewed today with patient. Compliant with blood pressure medicine. States does not miss a dose. No obvious side effects. Blood pressure generally good when checked elsewhere. Watching salt intake.                 Sinus symptoms off and on for 2 weeks. Cong and dranage built over a week or so, now frontal heafache, steady, worse with cough, occas low gr fever  Review of Systems No headache, no major weight loss or weight gain, no chest pain no back pain abdominal pain no change in bowel habits complete ROS otherwise negative     Objective:   Physical Exam Alert, mild malaise. Hydration good Vitals stable. frontal/ maxillary tenderness evident positive nasal congestion. pharynx normal neck supple  lungs clear/no crackles or wheezes. heart regular in rhythm        Assessment & Plan:  Impression rhinosinusitis likely post viral, discussed with patient. plan antibiotics prescribed. Questions answered. Symptomatic care discussed. warning signs discussed. WSL Plus hypertension good control maintain same meds diet exercise discussed recheck in 6 months

## 2017-03-28 ENCOUNTER — Other Ambulatory Visit: Payer: Self-pay | Admitting: Family Medicine

## 2017-04-23 ENCOUNTER — Other Ambulatory Visit: Payer: Self-pay | Admitting: Family Medicine

## 2017-04-28 ENCOUNTER — Ambulatory Visit (INDEPENDENT_AMBULATORY_CARE_PROVIDER_SITE_OTHER): Payer: BLUE CROSS/BLUE SHIELD | Admitting: Family Medicine

## 2017-04-28 ENCOUNTER — Encounter: Payer: Self-pay | Admitting: Family Medicine

## 2017-04-28 VITALS — BP 158/98 | Ht 66.0 in | Wt 267.0 lb

## 2017-04-28 DIAGNOSIS — Z13220 Encounter for screening for lipoid disorders: Secondary | ICD-10-CM

## 2017-04-28 DIAGNOSIS — Z79899 Other long term (current) drug therapy: Secondary | ICD-10-CM | POA: Diagnosis not present

## 2017-04-28 DIAGNOSIS — I10 Essential (primary) hypertension: Secondary | ICD-10-CM | POA: Diagnosis not present

## 2017-04-28 DIAGNOSIS — Z23 Encounter for immunization: Secondary | ICD-10-CM

## 2017-04-28 MED ORDER — NORGESTREL-ETHINYL ESTRADIOL 0.3-30 MG-MCG PO TABS: 1.0000 | ORAL_TABLET | Freq: Every day | ORAL | 5 refills | Status: DC

## 2017-04-28 MED ORDER — ENALAPRIL MALEATE 20 MG PO TABS: 20.0000 mg | ORAL_TABLET | Freq: Every day | ORAL | 5 refills | Status: DC

## 2017-04-28 NOTE — Progress Notes (Signed)
   Subjective:    Patient ID: Dana Hardy, female    DOB: [birth year], 46 y.o.   MRN: [id number]  Hypertension    Patient is here today to follow up on HTN. She is currently on Enalapril 20 mg one daily. She states she gets plenty of exercise through work. She does not eat healthy. She states she would like the flu shot today and needs refills on her BP med.  Blood pressure medicine and blood pressure levels reviewed today with patient. Compliant with blood pressure medicine. States does not miss a dose. No obvious side effects. Blood pressure generally good when checked elsewhere. Watching salt intake.  Stays physically active and stay exrcising  Also does horses etc.      Review of Systems No headache, no major weight loss or weight gain, no chest pain no back pain abdominal pain no change in bowel habits complete ROS otherwise negative     Objective:   Physical Exam Alert vitals stable, NAD. Blood pressure good on repeat. HEENT normal. Lungs clear. Heart regular rate and rhythm.        Assessment & Plan:  Impression hypertension good control discussed to maintain same dose diet exercise discussed.  Medications refilled.  Appropriate blood work.  Flu shot.

## 2017-08-31 ENCOUNTER — Other Ambulatory Visit: Payer: Self-pay | Admitting: Family Medicine

## 2017-10-15 ENCOUNTER — Other Ambulatory Visit: Payer: Self-pay | Admitting: Family Medicine

## 2017-11-03 ENCOUNTER — Encounter: Payer: Self-pay | Admitting: Family Medicine

## 2017-11-03 ENCOUNTER — Ambulatory Visit: Payer: BLUE CROSS/BLUE SHIELD | Admitting: Family Medicine

## 2017-11-03 VITALS — BP 132/90 | Ht 66.0 in | Wt 262.0 lb

## 2017-11-03 DIAGNOSIS — I10 Essential (primary) hypertension: Secondary | ICD-10-CM

## 2017-11-03 DIAGNOSIS — Z79899 Other long term (current) drug therapy: Secondary | ICD-10-CM

## 2017-11-03 DIAGNOSIS — Z13220 Encounter for screening for lipoid disorders: Secondary | ICD-10-CM

## 2017-11-03 MED ORDER — CHLORTHALIDONE 25 MG PO TABS: ORAL_TABLET | ORAL | 5 refills | Status: DC

## 2017-11-03 MED ORDER — ENALAPRIL MALEATE 20 MG PO TABS: ORAL_TABLET | ORAL | 5 refills | Status: DC

## 2017-11-03 MED ORDER — NORGESTREL-ETHINYL ESTRADIOL 0.3-30 MG-MCG PO TABS: 1.0000 | ORAL_TABLET | Freq: Every day | ORAL | 5 refills | Status: DC

## 2017-11-03 NOTE — Progress Notes (Signed)
   Subjective:    Patient ID: Dana Hardy, female    DOB: 1970/08/11, 47 y.o.   MRN: [id number]  Hypertension  This is a chronic problem. Treatments tried: enalapril . Compliance problems include diet and exercise (takes meds every day).    Needs refill on birth control and enalapril.   So so with cardio side of things, works hard   Pt working oncardio some, doing walking  Often   Watching salt intake compliant with meds  Blood pressure medicine and blood pressure levels reviewed today with patient. Compliant with blood pressure medicine. States does not miss a dose. No obvious side effects. Blood pressure generally good when checked elsewhere. Watching salt intake.      Review of Systems No headache, no major weight loss or weight gain, no chest pain no back pain abdominal pain no change in bowel habits complete ROS otherwise negative     Objective:   Physical Exam   Alert vitals stable, NAD. Blood pressure good on repeat. HEENT normal. Lungs clear. Heart regular rate and rhythm.      Assessment & Plan:  1 question hypertension.   suboptimum control discussed.  Blood pressure elevated in the past year.  And added chlorthalidone rationale discussed maintain same treatment.  2.  Preventive interventions patient weight on wellness, did not get blood work, did not do mammogram all discussed and encouraged.  Will refill birth control pills  Follow-up with me in 6 months.  Add chlorthalidone to hopefully wellness before that

## 2017-11-13 DIAGNOSIS — Z79899 Other long term (current) drug therapy: Secondary | ICD-10-CM | POA: Diagnosis not present

## 2017-11-13 DIAGNOSIS — I10 Essential (primary) hypertension: Secondary | ICD-10-CM | POA: Diagnosis not present

## 2017-11-13 DIAGNOSIS — Z13220 Encounter for screening for lipoid disorders: Secondary | ICD-10-CM | POA: Diagnosis not present

## 2017-11-14 ENCOUNTER — Other Ambulatory Visit: Payer: Self-pay | Admitting: Family Medicine

## 2017-11-14 ENCOUNTER — Encounter: Payer: Self-pay | Admitting: Family Medicine

## 2017-11-14 LAB — HEPATIC FUNCTION PANEL
ALBUMIN: 4.1 g/dL (ref 3.5–5.5)
ALK PHOS: 352 IU/L — AB (ref 39–117)
ALT: 16 IU/L (ref 0–32)
AST: 19 IU/L (ref 0–40)
BILIRUBIN TOTAL: 0.4 mg/dL (ref 0.0–1.2)
BILIRUBIN, DIRECT: 0.13 mg/dL (ref 0.00–0.40)
TOTAL PROTEIN: 6.9 g/dL (ref 6.0–8.5)

## 2017-11-14 LAB — LIPID PANEL
CHOLESTEROL TOTAL: 208 mg/dL — AB (ref 100–199)
Chol/HDL Ratio: 3.9 ratio (ref 0.0–4.4)
HDL: 53 mg/dL (ref >39)
LDL Calculated: 131 mg/dL — ABNORMAL HIGH (ref 0–99)
Triglycerides: 118 mg/dL (ref 0–149)
VLDL Cholesterol Cal: 24 mg/dL (ref 5–40)

## 2017-11-14 LAB — BASIC METABOLIC PANEL
BUN / CREAT RATIO: 19 (ref 9–23)
BUN: 16 mg/dL (ref 6–24)
CO2: 27 mmol/L (ref 20–29)
CREATININE: 0.84 mg/dL (ref 0.57–1.00)
Calcium: 9.6 mg/dL (ref 8.7–10.2)
Chloride: 97 mmol/L (ref 96–106)
GFR, EST AFRICAN AMERICAN: 97 mL/min/{1.73_m2} (ref >59)
GFR, EST NON AFRICAN AMERICAN: 84 mL/min/{1.73_m2} (ref >59)
Glucose: 75 mg/dL (ref 65–99)
Potassium: 4.5 mmol/L (ref 3.5–5.2)
Sodium: 140 mmol/L (ref 134–144)

## 2017-12-22 ENCOUNTER — Other Ambulatory Visit: Payer: Self-pay | Admitting: Family Medicine

## 2018-03-16 ENCOUNTER — Encounter: Payer: Self-pay | Admitting: Family Medicine

## 2018-03-16 ENCOUNTER — Ambulatory Visit (INDEPENDENT_AMBULATORY_CARE_PROVIDER_SITE_OTHER): Payer: BLUE CROSS/BLUE SHIELD | Admitting: Family Medicine

## 2018-03-16 VITALS — BP 134/78 | Ht 66.0 in | Wt 269.6 lb

## 2018-03-16 DIAGNOSIS — Z124 Encounter for screening for malignant neoplasm of cervix: Secondary | ICD-10-CM | POA: Diagnosis not present

## 2018-03-16 DIAGNOSIS — Z0000 Encounter for general adult medical examination without abnormal findings: Secondary | ICD-10-CM

## 2018-03-16 DIAGNOSIS — I10 Essential (primary) hypertension: Secondary | ICD-10-CM

## 2018-03-16 DIAGNOSIS — Z23 Encounter for immunization: Secondary | ICD-10-CM | POA: Diagnosis not present

## 2018-03-16 MED ORDER — ENALAPRIL MALEATE 20 MG PO TABS
ORAL_TABLET | ORAL | 5 refills | Status: DC
Start: 2018-03-16 — End: 2018-09-19

## 2018-03-16 MED ORDER — NORGESTREL-ETHINYL ESTRADIOL 0.3-30 MG-MCG PO TABS: 1.0000 | ORAL_TABLET | Freq: Every day | ORAL | 11 refills | Status: DC

## 2018-03-16 NOTE — Progress Notes (Signed)
Subjective:    Patient ID: Dana Hardy, female    DOB: 28-May-1971, 47 y.o.   MRN: [id number]  HPI The patient comes in today for a wellness visit.    A review of their health history was completed.  A review of medications was also completed.  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   Additional concerns: none  Blood pressure medicine and blood pressure levels reviewed today with patient. Compliant with blood pressure medicine. States does not miss a dose. No obvious side effects. Blood pressure generally good when checked elsewhere. Watching salt intake.  BP cks on occasion, numbers 134 ish over 80 or 82 ish  Results for orders placed or performed in visit on 11/03/17  Lipid Profile  Result Value Ref Range   Cholesterol, Total 208 (H) 100 - 199 mg/dL   Triglycerides 119118 0 - 149 mg/dL   HDL 53 >14>39 mg/dL   VLDL Cholesterol Cal 24 5 - 40 mg/dL   LDL Calculated 782131 (H) 0 - 99 mg/dL   Chol/HDL Ratio 3.9 0.0 - 4.4 ratio  Hepatic function panel  Result Value Ref Range   Total Protein 6.9 6.0 - 8.5 g/dL   Albumin 4.1 3.5 - 5.5 g/dL   Bilirubin Total 0.4 0.0 - 1.2 mg/dL   Bilirubin, Direct 9.560.13 0.00 - 0.40 mg/dL   Alkaline Phosphatase 352 (H) 39 - 117 IU/L   AST 19 0 - 40 IU/L   ALT 16 0 - 32 IU/L  Basic Metabolic Panel (BMET)  Result Value Ref Range   Glucose 75 65 - 99 mg/dL   BUN 16 6 - 24 mg/dL   Creatinine, Ser 2.130.84 0.57 - 1.00 mg/dL   GFR calc non Af Amer 84 >59 mL/min/1.73   GFR calc Af Amer 97 >59 mL/min/1.73   BUN/Creatinine Ratio 19 9 - 23   Sodium 140 134 - 144 mmol/L   Potassium 4.5 3.5 - 5.2 mmol/L   Chloride 97 96 - 106 mmol/L   CO2 27 20 - 29 mmol/L   Calcium 9.6 8.7 - 10.2 mg/dL    sinux type suymptoms strted kicking Friday or so   Used prn allergy pill sn eve a little fevrish and dim enregy with it  Still cough and cong ,    Review of Systems  Constitutional: Negative for activity change, appetite change and fatigue.  HENT: Negative for congestion and rhinorrhea.   Eyes: Negative for discharge.  Respiratory: Negative for cough, chest tightness and wheezing.   Cardiovascular: Negative for chest pain.  Gastrointestinal: Negative for abdominal pain, blood in stool and vomiting.  Endocrine: Negative for polyphagia.  Genitourinary: Negative for difficulty urinating and frequency.  Musculoskeletal: Negative for neck pain.  Skin: Negative for color change.  Allergic/Immunologic: Negative for environmental allergies and food allergies.  Neurological: Negative for weakness and headaches.  Psychiatric/Behavioral: Negative for agitation and behavioral problems.  All other systems reviewed and are negative.      Objective:   Physical Exam  Constitutional: She is oriented to person, place, and time. She appears well-developed and well-nourished.  HENT:  Head: Normocephalic.  Right Ear: External ear normal.  Left Ear: External ear normal.  Eyes: Pupils are equal, round, and reactive to  light.  Neck: Normal range of motion. No thyromegaly present.  Cardiovascular: Normal rate, regular rhythm, normal heart sounds and intact distal pulses.  No murmur heard. Pulmonary/Chest: Effort normal and breath sounds normal. No respiratory distress. She has no wheezes.  Abdominal: Soft. Bowel sounds are normal. She exhibits no distension and no mass. There is no tenderness.  Genitourinary: Vagina normal and uterus normal.  Genitourinary Comments: Pap obtained  Musculoskeletal: Normal range of motion. She exhibits no edema or tenderness.  Lymphadenopathy:    She has no cervical adenopathy.  Neurological: She is alert and oriented to person, place, and time. She exhibits  normal muscle tone.  Skin: Skin is warm and dry.  Psychiatric: She has a normal mood and affect. Her behavior is normal.          Assessment & Plan:  Impression 1 wellness exam.  Yearly mammogram encouraged.  Pap smear obtained.  Diet discussed.  Exercise discussed vaccines discussed and flu shot given  2.  Hypertension.  Good control discussed maintain same meds compliance discussed diet exercise discussed in this regard  #3 URI at risk for infection.  No evidence of true bacterial infection at this time symptom care discussed and encouraged  Birth control pills refilled.  Blood pressure medicine

## 2018-03-21 LAB — PAP IG W/ RFLX HPV ASCU: PAP Smear Comment: 0

## 2018-03-21 LAB — SPECIMEN STATUS REPORT

## 2018-05-18 ENCOUNTER — Other Ambulatory Visit: Payer: Self-pay | Admitting: Family Medicine

## 2018-09-19 ENCOUNTER — Other Ambulatory Visit: Payer: Self-pay | Admitting: Family Medicine

## 2018-09-21 ENCOUNTER — Other Ambulatory Visit: Payer: Self-pay

## 2018-09-21 ENCOUNTER — Ambulatory Visit (INDEPENDENT_AMBULATORY_CARE_PROVIDER_SITE_OTHER): Payer: BLUE CROSS/BLUE SHIELD | Admitting: Family Medicine

## 2018-09-21 ENCOUNTER — Encounter: Payer: Self-pay | Admitting: Family Medicine

## 2018-09-21 VITALS — BP 136/84 | Ht 66.0 in | Wt 261.8 lb

## 2018-09-21 DIAGNOSIS — I10 Essential (primary) hypertension: Secondary | ICD-10-CM | POA: Diagnosis not present

## 2018-09-21 MED ORDER — CHLORTHALIDONE 25 MG PO TABS: ORAL_TABLET | ORAL | 5 refills | Status: DC

## 2018-09-21 MED ORDER — ENALAPRIL MALEATE 20 MG PO TABS: 20.0000 mg | ORAL_TABLET | Freq: Every day | ORAL | 5 refills | Status: DC

## 2018-09-21 NOTE — Progress Notes (Signed)
   Subjective:    Patient ID: Dana Hardy, female    DOB: June 02, 1971, 48 y.o.   MRN: [id number]  Hypertension  This is a chronic problem. The current episode started more than 1 year ago. Risk factors for coronary artery disease include obesity. Treatments tried: vasotec and hygroton.    Blood pressure medicine and blood pressure levels reviewed today with patient. Compliant with blood pressure medicine. States does not miss a dose. No obvious side effects. Blood pressure generally good when checked elsewhere. Watching salt intake.   Review of Systems No headache, no major weight loss or weight gain, no chest pain no back pain abdominal pain no change in bowel habits complete ROS otherwise negative     Objective:   Physical Exam  Alert vitals stable, NAD. Blood pressure good on repeat. HEENT normal. Lungs clear. Heart regular rate and rhythm.       Assessment & Plan:  Takes meds faithful;y  Impression hypertension good control discussed maintain same meds diet exercise discussed meds refilled follow-up in 6 months for wellness plus chronic

## 2018-09-21 NOTE — Progress Notes (Signed)
   Subjective:    Patient ID: Dana Hardy, female    DOB: [birth year], 48 y.o.   MRN: [id number]  HPI    Review of Systems     Objective:   Physical Exam        Assessment & Plan:

## 2018-10-27 ENCOUNTER — Other Ambulatory Visit: Payer: Self-pay | Admitting: Family Medicine

## 2019-03-20 ENCOUNTER — Other Ambulatory Visit: Payer: Self-pay | Admitting: Family Medicine

## 2019-03-20 NOTE — Telephone Encounter (Signed)
Three mo worth 

## 2019-06-16 ENCOUNTER — Other Ambulatory Visit: Payer: Self-pay | Admitting: Family Medicine

## 2019-06-16 NOTE — Telephone Encounter (Signed)
Needs physical sometime in the next few months.

## 2019-06-19 ENCOUNTER — Telehealth: Payer: Self-pay | Admitting: Family Medicine

## 2019-06-19 DIAGNOSIS — I10 Essential (primary) hypertension: Secondary | ICD-10-CM

## 2019-06-19 DIAGNOSIS — Z79899 Other long term (current) drug therapy: Secondary | ICD-10-CM

## 2019-06-19 DIAGNOSIS — Z13220 Encounter for screening for lipoid disorders: Secondary | ICD-10-CM

## 2019-06-19 NOTE — Telephone Encounter (Signed)
Same plus cbc 

## 2019-06-19 NOTE — Telephone Encounter (Signed)
Last labs completed on 11/13/17 BMET, Lipid and liver. Please advise. Thank you

## 2019-06-19 NOTE — Addendum Note (Signed)
Addended by: Dairl Ponder on: 06/19/2019 04:36 PM   Modules accepted: Orders

## 2019-06-19 NOTE — Telephone Encounter (Signed)
Pt has CPE scheduled with Dr. Richardson Landry on 07/05/19. Also would like to get lab work done before appt.

## 2019-06-19 NOTE — Telephone Encounter (Signed)
lvm for pt to schedule physical 

## 2019-06-19 NOTE — Telephone Encounter (Signed)
Pt requesting lab orders for upcoming physical. Last labs completed on 11/13/17 BMET, Lipid and liver. Please advise. Thank you

## 2019-06-19 NOTE — Telephone Encounter (Signed)
Blood work ordered in Epic. Patient notified. 

## 2019-07-05 ENCOUNTER — Encounter: Payer: BLUE CROSS/BLUE SHIELD | Admitting: Family Medicine

## 2019-07-20 ENCOUNTER — Other Ambulatory Visit: Payer: Self-pay

## 2019-07-20 ENCOUNTER — Encounter: Payer: Self-pay | Admitting: Family Medicine

## 2019-07-20 ENCOUNTER — Ambulatory Visit: Payer: 59 | Admitting: Family Medicine

## 2019-07-20 VITALS — BP 138/90 | Temp 97.5°F | Ht 66.5 in | Wt 275.4 lb

## 2019-07-20 DIAGNOSIS — Z23 Encounter for immunization: Secondary | ICD-10-CM

## 2019-07-20 DIAGNOSIS — Z0000 Encounter for general adult medical examination without abnormal findings: Secondary | ICD-10-CM | POA: Diagnosis not present

## 2019-07-20 MED ORDER — CHLORTHALIDONE 25 MG PO TABS: ORAL_TABLET | ORAL | 5 refills | Status: DC

## 2019-07-20 MED ORDER — LOW-OGESTREL 0.3-30 MG-MCG PO TABS: 1.0000 | ORAL_TABLET | Freq: Every day | ORAL | 11 refills | Status: DC

## 2019-07-20 MED ORDER — ENALAPRIL MALEATE 20 MG PO TABS: 20.0000 mg | ORAL_TABLET | Freq: Every day | ORAL | 5 refills | Status: DC

## 2019-07-20 NOTE — Progress Notes (Signed)
Subjective:    Patient ID: Dana Hardy, female    DOB: 20-Sep-1970, 49 y.o.   MRN: [id number]  HPI The patient comes in today for a wellness visit.    A review of their health history was completed.  A review of medications was also completed.  Any needed refills; pt has just picked up refills; pt is good on all meds at this time.   Eating habits: trying to be healthy   Falls/  MVA accidents in past few months: none  Regular exercise: pt has very active job  Barrister's clerk pt sees on regular basis: none  Preventative health issues were discussed.   Additional concerns:   Blood pressure medicine and blood pressure levels reviewed today with patient. Compliant with blood pressure medicine. States does not miss a dose. No obvious side effects. Blood pressure generally good when checked elsewhere. Watching salt intake.  Walking and exrcising and staying reg active,,  Does ten thousand steps per day Not cking currently  b  w pending    Review of Systems  Constitutional: Negative for activity change, appetite change and fatigue.  HENT: Negative for congestion and rhinorrhea.   Eyes: Negative for discharge.  Respiratory: Negative for cough, chest tightness and wheezing.   Cardiovascular: Negative for chest pain.  Gastrointestinal: Negative for abdominal pain, blood in stool and vomiting.  Endocrine: Negative for polyphagia.  Genitourinary: Negative for difficulty urinating and frequency.  Musculoskeletal: Negative for neck pain.  Skin: Negative for color change.  Allergic/Immunologic: Negative for environmental allergies and food allergies.  Neurological: Negative for weakness and headaches.  Psychiatric/Behavioral: Negative for agitation and behavioral problems.  All other systems reviewed and are negative.      Objective:   Physical Exam Vitals reviewed.  Constitutional:      Appearance: She is well-developed.  HENT:     Head: Normocephalic.     Right Ear:  External ear normal.     Left Ear: External ear normal.  Eyes:     Pupils: Pupils are equal, round, and reactive to light.  Neck:     Thyroid: No thyromegaly.  Cardiovascular:     Rate and Rhythm: Normal rate and regular rhythm.     Heart sounds: Normal heart sounds. No murmur.  Pulmonary:     Effort: Pulmonary effort is normal. No respiratory distress.     Breath sounds: Normal breath sounds. No wheezing.  Abdominal:     General: Bowel sounds are normal. There is no distension.     Palpations: Abdomen is soft. There is no mass.     Tenderness: There is no abdominal tenderness.  Musculoskeletal:        General: No tenderness. Normal range of motion.     Cervical back: Normal range of motion.  Lymphadenopathy:     Cervical: No cervical adenopathy.  Skin:    General: Skin is warm and dry.  Neurological:     Mental Status: She is alert and oriented to person, place, and time.     Motor: No abnormal muscle tone.  Psychiatric:        Behavior: Behavior normal.     Breast exam within normal limits.  Pelvic exam normal.  Pap not obtained.  Rectal exam normal      Assessment & Plan:  Impression wellness exam.  Diet discussed.  Exercise discussed.  Vaccines discussed.  Flu shot today.  Covid vaccine discussed  2.  Hypertension.  Good control on repeat to maintain same meds follow-up  in 6 months  Diet exercise discussed medications refilled encouraged to get blood work.  Patient declines mammogram for now I encouraged her to get at this summer wants COVID-19 calms down.

## 2019-08-02 ENCOUNTER — Encounter: Payer: Self-pay | Admitting: Family Medicine

## 2019-10-17 ENCOUNTER — Telehealth: Payer: Self-pay | Admitting: Family Medicine

## 2019-10-17 MED ORDER — CHLORTHALIDONE 25 MG PO TABS: ORAL_TABLET | ORAL | 5 refills | Status: DC

## 2019-10-17 NOTE — Telephone Encounter (Signed)
Refill sent to pharmacy and pt is aware. 

## 2019-10-17 NOTE — Telephone Encounter (Signed)
Patient went to pick up prescriptions this morning and for some reason the pharmacy is saying she doesn't have any refills on her chlorthalidone 25 mg.  She has refills on her other 2 meds.  All 3 were sent on 07/20/19 with mulitiple refills but the pharmacy is stating they don't have refills for that one and needs [place] to resend the script.  [facility], [address] [place]

## 2019-11-22 ENCOUNTER — Telehealth: Payer: Self-pay | Admitting: *Deleted

## 2019-11-22 DIAGNOSIS — Z79899 Other long term (current) drug therapy: Secondary | ICD-10-CM

## 2019-11-22 DIAGNOSIS — R5383 Other fatigue: Secondary | ICD-10-CM

## 2019-11-22 DIAGNOSIS — Z13220 Encounter for screening for lipoid disorders: Secondary | ICD-10-CM

## 2019-11-22 DIAGNOSIS — I10 Essential (primary) hypertension: Secondary | ICD-10-CM

## 2019-11-22 NOTE — Telephone Encounter (Signed)
Pls order cbc, cmp, lipid panel, tsh.  Fasting, and obtain 3-7 days prior to appt.  Thx,   Dr. Ladona Ridgel

## 2019-11-22 NOTE — Telephone Encounter (Signed)
Patient scheduled for med check 01/17/20  Last labs:  06/18/20- Lipid, Liver, Met 7 and CBC

## 2019-11-22 NOTE — Telephone Encounter (Signed)
Blood work ordered in Epic. Patient notified. 

## 2020-01-17 ENCOUNTER — Other Ambulatory Visit: Payer: Self-pay

## 2020-01-17 ENCOUNTER — Ambulatory Visit: Payer: 59 | Admitting: Family Medicine

## 2020-01-17 VITALS — BP 134/86 | HR 84 | Temp 97.3°F | Ht 66.5 in | Wt 273.0 lb

## 2020-01-17 DIAGNOSIS — Z1231 Encounter for screening mammogram for malignant neoplasm of breast: Secondary | ICD-10-CM | POA: Diagnosis not present

## 2020-01-17 DIAGNOSIS — I10 Essential (primary) hypertension: Secondary | ICD-10-CM

## 2020-01-17 NOTE — Progress Notes (Signed)
Patient ID: Dana Hardy, female    DOB: October 02, 1970, 49 y.o.   MRN: [id number]   Chief Complaint  Patient presents with  . Hypertension    follow up   Subjective:    HPI  H/o htn- seen for f/u. Taking chlorthalidione 1/2 tab had it today. Enalapril at night 20mg . No chest pain, headache, dizziness, LE edema.  Having some perimenopauseal symptoms taking bcp.  Doing ok with it, not having many night flashes.  Younger sister in bad relationship and pt having more stress than usual.  Working with sister and going to court dealing with pressing charges on assault with sister's ex.  Hasn't had time to get labs yet, will get today.  Medical History Dana Hardy has no past medical history on file.   Outpatient Encounter Medications as of 01/17/2020  Medication Sig  . chlorthalidone (HYGROTON) 25 MG tablet TAKE 1/2 TABLET BY MOUTH EVERY MORNING  . enalapril (VASOTEC) 20 MG tablet Take 1 tablet (20 mg total) by mouth daily.  . norgestrel-ethinyl estradiol (LOW-OGESTREL) 0.3-30 MG-MCG tablet Take 1 tablet by mouth daily.   No facility-administered encounter medications on file as of 01/17/2020.     Review of Systems  Constitutional: Negative for chills and fever.  HENT: Negative for congestion, rhinorrhea and sore throat.   Respiratory: Negative for cough, shortness of breath and wheezing.   Cardiovascular: Negative for chest pain and leg swelling.  Gastrointestinal: Negative for abdominal pain, diarrhea, nausea and vomiting.  Genitourinary: Negative for dysuria and frequency.  Musculoskeletal: Negative for arthralgias and back pain.  Skin: Negative for rash.  Neurological: Negative for dizziness, weakness and headaches.     Vitals BP 134/86   Pulse 84   Temp (!) 97.3 F (36.3 C) (Oral)   Ht 5' 6.5" (1.689 m)   Wt 273 lb (123.8 kg)   SpO2 96%   BMI 43.40 kg/m   Objective:   Physical Exam Vitals and nursing note reviewed.  Constitutional:      General: She is  not in acute distress.    Appearance: Normal appearance.  HENT:     Head: Normocephalic and atraumatic.  Cardiovascular:     Rate and Rhythm: Normal rate and regular rhythm.     Pulses: Normal pulses.     Heart sounds: Normal heart sounds.  Pulmonary:     Effort: Pulmonary effort is normal.     Breath sounds: Normal breath sounds. No wheezing, rhonchi or rales.  Musculoskeletal:        General: Normal range of motion.     Right lower leg: No edema.     Left lower leg: No edema.  Skin:    General: Skin is warm and dry.     Findings: No lesion or rash.  Neurological:     General: No focal deficit present.     Mental Status: She is alert and oriented to person, place, and time.     Cranial Nerves: No cranial nerve deficit.  Psychiatric:        Mood and Affect: Mood normal.        Behavior: Behavior normal.        Thought Content: Thought content normal.        Judgment: Judgment normal.      Assessment and Plan   1. Essential hypertension, benign  -suboptimal.   - prescript for blood cuff given.  Increase hctz from 12.5mg  to 25mg  in Am and continue with enalapril 20mg  qhs.  May need to  move enalapril to am, or add small 10mg  enalapril to am meds. recehck in 6 mo. Pt to get cuff and call if seeing high numbers.  Pt to get labs today.  Will call with results.  F/u 73mo.

## 2020-01-18 ENCOUNTER — Telehealth: Payer: Self-pay | Admitting: Family Medicine

## 2020-01-18 LAB — CBC WITH DIFFERENTIAL/PLATELET
Basophils Absolute: 0.1 10*3/uL (ref 0.0–0.2)
Basos: 1 %
EOS (ABSOLUTE): 0.1 10*3/uL (ref 0.0–0.4)
Eos: 1 %
Hematocrit: 43.6 % (ref 34.0–46.6)
Hemoglobin: 14.9 g/dL (ref 11.1–15.9)
Immature Grans (Abs): 0 10*3/uL (ref 0.0–0.1)
Immature Granulocytes: 0 %
Lymphocytes Absolute: 1.2 10*3/uL (ref 0.7–3.1)
Lymphs: 15 %
MCH: 30.3 pg (ref 26.6–33.0)
MCHC: 34.2 g/dL (ref 31.5–35.7)
MCV: 89 fL (ref 79–97)
Monocytes Absolute: 0.5 10*3/uL (ref 0.1–0.9)
Monocytes: 7 %
Neutrophils Absolute: 6.2 10*3/uL (ref 1.4–7.0)
Neutrophils: 76 %
Platelets: 287 10*3/uL (ref 150–450)
RBC: 4.91 x10E6/uL (ref 3.77–5.28)
RDW: 12.9 % (ref 11.7–15.4)
WBC: 8 10*3/uL (ref 3.4–10.8)

## 2020-01-18 LAB — COMPREHENSIVE METABOLIC PANEL
ALT: 16 IU/L (ref 0–32)
AST: 18 IU/L (ref 0–40)
Albumin/Globulin Ratio: 1.3 (ref 1.2–2.2)
Albumin: 4.2 g/dL (ref 3.8–4.8)
Alkaline Phosphatase: 308 IU/L — ABNORMAL HIGH (ref 48–121)
BUN/Creatinine Ratio: 15 (ref 9–23)
BUN: 13 mg/dL (ref 6–24)
Bilirubin Total: 0.3 mg/dL (ref 0.0–1.2)
CO2: 27 mmol/L (ref 20–29)
Calcium: 9.7 mg/dL (ref 8.7–10.2)
Chloride: 96 mmol/L (ref 96–106)
Creatinine, Ser: 0.88 mg/dL (ref 0.57–1.00)
GFR calc Af Amer: 90 mL/min/{1.73_m2} (ref >59)
GFR calc non Af Amer: 78 mL/min/{1.73_m2} (ref >59)
Globulin, Total: 3.3 g/dL (ref 1.5–4.5)
Glucose: 85 mg/dL (ref 65–99)
Potassium: 4.3 mmol/L (ref 3.5–5.2)
Sodium: 136 mmol/L (ref 134–144)
Total Protein: 7.5 g/dL (ref 6.0–8.5)

## 2020-01-18 LAB — TSH: TSH: 3.25 u[IU]/mL (ref 0.450–4.500)

## 2020-01-18 LAB — LIPID PANEL
Chol/HDL Ratio: 4.2 ratio (ref 0.0–4.4)
Cholesterol, Total: 229 mg/dL — ABNORMAL HIGH (ref 100–199)
HDL: 55 mg/dL (ref >39)
LDL Chol Calc (NIH): 141 mg/dL — ABNORMAL HIGH (ref 0–99)
Triglycerides: 182 mg/dL — ABNORMAL HIGH (ref 0–149)
VLDL Cholesterol Cal: 33 mg/dL (ref 5–40)

## 2020-01-19 NOTE — Telephone Encounter (Signed)
Patient notified of results and verbalized understanding. Patient states she did see GI for the elevated liver enzymes in the past.

## 2020-03-11 ENCOUNTER — Other Ambulatory Visit: Payer: Self-pay | Admitting: *Deleted

## 2020-03-11 MED ORDER — ENALAPRIL MALEATE 20 MG PO TABS: 20.0000 mg | ORAL_TABLET | Freq: Every day | ORAL | 0 refills | Status: DC

## 2020-04-10 ENCOUNTER — Other Ambulatory Visit: Payer: Self-pay | Admitting: Family Medicine

## 2020-04-25 ENCOUNTER — Other Ambulatory Visit: Payer: Self-pay

## 2020-04-25 ENCOUNTER — Ambulatory Visit (HOSPITAL_COMMUNITY)
Admission: RE | Admit: 2020-04-25 | Discharge: 2020-04-25 | Disposition: A | Discharge status: Home / Self Care | Payer: 59 | Source: Ambulatory Visit | Attending: Family Medicine | Admitting: Family Medicine

## 2020-04-25 DIAGNOSIS — Z1231 Encounter for screening mammogram for malignant neoplasm of breast: Secondary | ICD-10-CM | POA: Diagnosis not present

## 2020-07-22 ENCOUNTER — Ambulatory Visit: Payer: 59 | Admitting: Family Medicine

## 2020-07-24 ENCOUNTER — Other Ambulatory Visit: Payer: Self-pay

## 2020-07-24 ENCOUNTER — Ambulatory Visit (INDEPENDENT_AMBULATORY_CARE_PROVIDER_SITE_OTHER): Payer: 59 | Admitting: Family Medicine

## 2020-07-24 ENCOUNTER — Encounter: Payer: Self-pay | Admitting: Family Medicine

## 2020-07-24 VITALS — BP 140/82 | HR 85 | Temp 98.4°F | Wt 261.6 lb

## 2020-07-24 DIAGNOSIS — Z23 Encounter for immunization: Secondary | ICD-10-CM

## 2020-07-24 DIAGNOSIS — Z3041 Encounter for surveillance of contraceptive pills: Secondary | ICD-10-CM | POA: Diagnosis not present

## 2020-07-24 DIAGNOSIS — M25562 Pain in left knee: Secondary | ICD-10-CM | POA: Diagnosis not present

## 2020-07-24 DIAGNOSIS — I10 Essential (primary) hypertension: Secondary | ICD-10-CM | POA: Diagnosis not present

## 2020-07-24 MED ORDER — CHLORTHALIDONE 25 MG PO TABS: ORAL_TABLET | ORAL | 1 refills | Status: DC

## 2020-07-24 MED ORDER — ENALAPRIL MALEATE 20 MG PO TABS: ORAL_TABLET | ORAL | 1 refills | Status: DC

## 2020-07-24 NOTE — Progress Notes (Signed)
Patient ID: Dana Hardy, female    DOB: Nov 08, 1970, 50 y.o.   MRN: [id number]   Chief Complaint  Patient presents with  . Hypertension   Subjective:    HPI Pt seen for htn.  HM- No h/o colon cancer.  Hasn't had colonoscopy  No blood in stool or black stool.   covid booster in 12/21. Had vaccine covid, 2x- moderna.  Due 9/22- pap smear mammo due 10/22.  Left- knee hurting from twisting it from ice. In the last 1-2 wks.  Had on brace.  Not improved much.  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  Ibuprofen prn.   Medical History Calise has no past medical history on file.   Outpatient Encounter Medications as of 07/24/2020  Medication Sig  . norgestrel-ethinyl estradiol (LOW-OGESTREL) 0.3-30 MG-MCG tablet Take 1 tablet by mouth daily.  . chlorthalidone (HYGROTON) 25 MG tablet TAKE 1/2 TABLET BY MOUTH EVERY MORNING  . enalapril (VASOTEC) 20 MG tablet TAKE 1 TABLET(20 MG) BY MOUTH DAILY  . [DISCONTINUED] chlorthalidone (HYGROTON) 25 MG tablet TAKE 1/2 TABLET BY MOUTH EVERY MORNING  . [DISCONTINUED] enalapril (VASOTEC) 20 MG tablet TAKE 1 TABLET(20 MG) BY MOUTH DAILY  . [DISCONTINUED] norgestrel-ethinyl estradiol (LOW-OGESTREL) 0.3-30 MG-MCG tablet Take 1 tablet by mouth daily.   No facility-administered encounter medications on file as of 07/24/2020.     Review of Systems  Constitutional: Negative for chills and fever.  HENT: Negative for congestion, rhinorrhea and sore throat.   Respiratory: Negative for cough, shortness of breath and wheezing.   Cardiovascular: Negative for chest pain and leg swelling.  Gastrointestinal: Negative for abdominal pain, diarrhea, nausea and vomiting.  Genitourinary: Negative for dysuria and frequency.  Musculoskeletal: Positive for arthralgias (left knee pain). Negative for back pain.  Skin: Negative for rash.  Neurological: Negative for dizziness, weakness and headaches.      Vitals BP 140/82   Pulse 85   Temp 98.4 F (36.9 C)   Wt 261 lb 9.6 oz (118.7 kg)   SpO2 96%   BMI 41.59 kg/m   Objective:   Physical Exam Vitals and nursing note reviewed.  Constitutional:      Appearance: Normal appearance.  HENT:     Head: Normocephalic and atraumatic.     Nose: Nose normal.     Mouth/Throat:     Mouth: Mucous membranes are moist.     Pharynx: Oropharynx is clear.  Eyes:     Extraocular Movements: Extraocular movements intact.     Conjunctiva/sclera: Conjunctivae normal.     Pupils: Pupils are equal, round, and reactive to light.  Cardiovascular:     Rate and Rhythm: Normal rate and regular rhythm.     Pulses: Normal pulses.     Heart sounds: Normal heart sounds.  Pulmonary:     Effort: Pulmonary effort is normal.     Breath sounds: Normal breath sounds. No wheezing, rhonchi or rales.  Musculoskeletal:     Right lower leg: No edema.     Left lower leg: No edema.     Comments: +left knee, pain with rom.  No erythema warmth or swelling.   Skin:    General: Skin is warm and dry.     Findings: No lesion or rash.  Neurological:     General: No focal deficit present.     Mental Status: She is alert and oriented to person, place, and time.  Psychiatric:  Mood and Affect: Mood normal.        Behavior: Behavior normal.      Assessment and Plan   1. Essential hypertension, benign - CMP14+EGFR - Lipid panel - chlorthalidone (HYGROTON) 25 MG tablet; TAKE 1/2 TABLET BY MOUTH EVERY MORNING  Dispense: 45 tablet; Refill: 1 - enalapril (VASOTEC) 20 MG tablet; TAKE 1 TABLET(20 MG) BY MOUTH DAILY  Dispense: 90 tablet; Refill: 1  2. Need for vaccination - Flu Vaccine QUAD 6+ mos PF IM (Fluarix Quad PF)  3. Acute pain of left knee  4. Encounter for surveillance of contraceptive pills - norgestrel-ethinyl estradiol (LOW-OGESTREL) 0.3-30 MG-MCG tablet; Take 1 tablet by mouth daily.  Dispense: 84 tablet; Refill: 3   Knee pain- rest, ice  elevation.  Ibuprofen 847m tid.  Call if not improving.  Pt requesting refill on bcp. Pt not due for pap till 9/22. Doing well on this medicaitons.  htn- suboptimal.  Taking chlorthalidone and enalapril.  Pt stating having pain today, so bp may be up related to this.  Will recheck next visit.  Pt wanting the flu vaccine.  F/u 620mor prn.

## 2020-07-26 ENCOUNTER — Telehealth: Payer: Self-pay

## 2020-07-26 NOTE — Telephone Encounter (Signed)
Refills have not been sent in. Please advise. Thank you

## 2020-07-26 NOTE — Telephone Encounter (Signed)
[facility] telling patient they did not receive the refill confirmed receipt 01/21 at 10:24 but they need it resent again norgestrel-ethinyl estradiol (LOW-OGESTREL) 0.3-30 MG-MCG tablet [[id number]]  [id number] - [place], [place] - [address] AT [facility] [address]   Pt call back [phone]

## 2020-07-29 ENCOUNTER — Other Ambulatory Visit: Payer: Self-pay | Admitting: *Deleted

## 2020-07-29 ENCOUNTER — Telehealth: Payer: Self-pay | Admitting: Family Medicine

## 2020-07-29 MED ORDER — LOW-OGESTREL 0.3-30 MG-MCG PO TABS: 1.0000 | ORAL_TABLET | Freq: Every day | ORAL | 1 refills | Status: DC

## 2020-07-29 NOTE — Telephone Encounter (Signed)
Pt notified that she needs physical and refill sent in per protocol but over due for physical . Last one was 07/20/19. Pt states her insurance will not pay for another visit because she was just seen this month. I explained to her that she was seen for med check up and physical is a separate visit she states she will check with insurance before scheduling another appt.

## 2020-07-29 NOTE — Telephone Encounter (Signed)
Patient states she has been trying for a week to get her birthcontrol pills.[facility]

## 2020-07-30 MED ORDER — LOW-OGESTREL 0.3-30 MG-MCG PO TABS: 1.0000 | ORAL_TABLET | Freq: Every day | ORAL | 3 refills | Status: DC

## 2020-07-30 NOTE — Telephone Encounter (Signed)
Sent them. Thx. Dr. Ladona Ridgel

## 2020-07-30 NOTE — Telephone Encounter (Signed)
Pt returned call and verbalized understanding  

## 2020-07-30 NOTE — Telephone Encounter (Signed)
She is due for a pap smear in 2022.  So she can schedule that on her next visit, if she has an appt in 7/22?  Thx,   Dr. Ladona Ridgel

## 2020-07-30 NOTE — Telephone Encounter (Signed)
Left message to return call 

## 2020-07-30 NOTE — Telephone Encounter (Signed)
Discussed with pt that she can do physical at her next appt. Please change to physical on the schedule. Her appt is 01/23/21

## 2020-10-11 LAB — LIPID PANEL
Chol/HDL Ratio: 4.1 ratio (ref 0.0–4.4)
Cholesterol, Total: 222 mg/dL — ABNORMAL HIGH (ref 100–199)
HDL: 54 mg/dL (ref >39)
LDL Chol Calc (NIH): 132 mg/dL — ABNORMAL HIGH (ref 0–99)
Triglycerides: 203 mg/dL — ABNORMAL HIGH (ref 0–149)
VLDL Cholesterol Cal: 36 mg/dL (ref 5–40)

## 2020-10-11 LAB — CMP14+EGFR
ALT: 16 IU/L (ref 0–32)
AST: 21 IU/L (ref 0–40)
Albumin/Globulin Ratio: 1.3 (ref 1.2–2.2)
Albumin: 4.1 g/dL (ref 3.8–4.8)
Alkaline Phosphatase: 323 IU/L — ABNORMAL HIGH (ref 44–121)
BUN/Creatinine Ratio: 18 (ref 9–23)
BUN: 16 mg/dL (ref 6–24)
Bilirubin Total: 0.3 mg/dL (ref 0.0–1.2)
CO2: 24 mmol/L (ref 20–29)
Calcium: 9.6 mg/dL (ref 8.7–10.2)
Chloride: 98 mmol/L (ref 96–106)
Creatinine, Ser: 0.91 mg/dL (ref 0.57–1.00)
Globulin, Total: 3.2 g/dL (ref 1.5–4.5)
Glucose: 75 mg/dL (ref 65–99)
Potassium: 4.3 mmol/L (ref 3.5–5.2)
Sodium: 138 mmol/L (ref 134–144)
Total Protein: 7.3 g/dL (ref 6.0–8.5)
eGFR: 78 mL/min/{1.73_m2} (ref >59)

## 2021-01-04 ENCOUNTER — Other Ambulatory Visit: Payer: Self-pay | Admitting: Family Medicine

## 2021-01-04 DIAGNOSIS — I10 Essential (primary) hypertension: Secondary | ICD-10-CM

## 2021-01-07 ENCOUNTER — Encounter: Payer: Self-pay | Admitting: Family Medicine

## 2021-01-07 DIAGNOSIS — R748 Abnormal levels of other serum enzymes: Secondary | ICD-10-CM | POA: Insufficient documentation

## 2021-01-07 NOTE — Telephone Encounter (Signed)
Needs f/u appt for recheck bp. In next 30 days.  Also has she had the alkaline phos number evaluate and what was the outcome of the liver biopsy, does she still see GI for this?  Thx. Dr. Ladona Ridgel

## 2021-01-23 ENCOUNTER — Ambulatory Visit: Payer: 59 | Admitting: Family Medicine

## 2021-01-23 ENCOUNTER — Other Ambulatory Visit: Payer: Self-pay

## 2021-01-23 ENCOUNTER — Encounter: Payer: Self-pay | Admitting: Family Medicine

## 2021-01-23 VITALS — BP 138/86 | HR 63 | Temp 98.1°F | Ht 66.5 in | Wt 270.0 lb

## 2021-01-23 DIAGNOSIS — E782 Mixed hyperlipidemia: Secondary | ICD-10-CM

## 2021-01-23 DIAGNOSIS — I10 Essential (primary) hypertension: Secondary | ICD-10-CM | POA: Diagnosis not present

## 2021-01-23 DIAGNOSIS — R748 Abnormal levels of other serum enzymes: Secondary | ICD-10-CM

## 2021-01-23 MED ORDER — ENALAPRIL MALEATE 20 MG PO TABS: ORAL_TABLET | ORAL | 1 refills | Status: DC

## 2021-01-23 MED ORDER — CHLORTHALIDONE 25 MG PO TABS
ORAL_TABLET | ORAL | 1 refills | Status: DC
Start: 2021-01-23 — End: 2021-08-18

## 2021-01-23 NOTE — Progress Notes (Signed)
Patient ID: Dana Hardy, female    DOB: Feb 23, 1971, 50 y.o.   MRN: [id number]   Chief Complaint  Patient presents with   Hypertension   Subjective:    HPI HTN Pt compliant with BP meds.  No SEs Denies chest pain, sob, LE swelling, or blurry vision. Taking chlorthalidone 1/2 tab in am,  And enalapril 24m at night.  Has h/o hemangioma on liver and had biopsy of liver in 2010.  Has had elevation of alk phos.  Has been elevated in 2014 also. Has had a cholecystectomy.  Bcp pill to help with irregular cycles and hot flashes.  Pt is not a smoker. Getting a cycle monthly and very light 1-2 days.  +1 pitting edema bilaterally.  Medical History [place] no past medical history on file.   Outpatient Encounter Medications as of 01/23/2021  Medication Sig   norgestrel-ethinyl estradiol (LOW-OGESTREL) 0.3-30 MG-MCG tablet Take 1 tablet by mouth daily.   [DISCONTINUED] chlorthalidone (HYGROTON) 25 MG tablet TAKE 1/2 TABLET BY MOUTH EVERY MORNING   [DISCONTINUED] enalapril (VASOTEC) 20 MG tablet TAKE 1 TABLET(20 MG) BY MOUTH DAILY   chlorthalidone (HYGROTON) 25 MG tablet TAKE 1/2 TABLET BY MOUTH EVERY MORNING   enalapril (VASOTEC) 20 MG tablet TAKE 1 TABLET(20 MG) BY MOUTH DAILY   No facility-administered encounter medications on file as of 01/23/2021.     Review of Systems  Constitutional:  Negative for chills and fever.  HENT:  Negative for congestion, rhinorrhea and sore throat.   Respiratory:  Negative for cough, shortness of breath and wheezing.   Cardiovascular:  Negative for chest pain and leg swelling.  Gastrointestinal:  Negative for abdominal pain, diarrhea, nausea and vomiting.  Genitourinary:  Negative for dysuria and frequency.  Musculoskeletal:  Negative for arthralgias and back pain.  Skin:  Negative for rash.  Neurological:  Negative for dizziness, weakness and headaches.    Vitals BP 138/86   Pulse 63   Temp 98.1 F (36.7 C)   Ht 5' 6.5" (1.689  m)   Wt 270 lb (122.5 kg)   SpO2 99%   BMI 42.93 kg/m   Objective:   Physical Exam Vitals and nursing note reviewed.  Constitutional:      Appearance: Normal appearance.  HENT:     Head: Normocephalic and atraumatic.     Nose: Nose normal.     Mouth/Throat:     Mouth: Mucous membranes are moist.     Pharynx: Oropharynx is clear.  Eyes:     Extraocular Movements: Extraocular movements intact.     Conjunctiva/sclera: Conjunctivae normal.     Pupils: Pupils are equal, round, and reactive to light.  Cardiovascular:     Rate and Rhythm: Normal rate and regular rhythm.     Pulses: Normal pulses.     Heart sounds: Normal heart sounds.  Pulmonary:     Effort: Pulmonary effort is normal.     Breath sounds: Normal breath sounds. No wheezing, rhonchi or rales.  Musculoskeletal:        General: Normal range of motion.     Right lower leg: Edema present.     Left lower leg: Edema present.  Skin:    General: Skin is warm and dry.     Findings: No lesion or rash.  Neurological:     General: No focal deficit present.     Mental Status: She is alert and oriented to person, place, and time.  Psychiatric:        Mood  and Affect: Mood normal.        Behavior: Behavior normal.     Assessment and Plan   1. Essential hypertension, benign - chlorthalidone (HYGROTON) 25 MG tablet; TAKE 1/2 TABLET BY MOUTH EVERY MORNING  Dispense: 45 tablet; Refill: 1 - enalapril (VASOTEC) 20 MG tablet; TAKE 1 TABLET(20 MG) BY MOUTH DAILY  Dispense: 90 tablet; Refill: 1  2. Mixed hyperlipidemia  3. Elevated alkaline phosphatase level   Cont diet modifications dec salt and cholesterol in diet. Increase in exercising. F/u in 40mo Elevated alk phos- in the 300s last few lab visits. Will  cont to monitor, may need referral to GI if cont to elevate. H/o cholecystectomy. Has normal LFTs. H/o liver biopsy in 2010 for evaluation of hepatic lesion, pt stating was a "hemangioma".  Seen by GI- Dr. RLaural Goldenin the  past. Recheck labs on  next visit.  Pt is asymptomatic.  Return in about 6 months (around 07/26/2021) for f/u htn.  BP Readings from Last 3 Encounters:  01/23/21 138/86  07/24/20 140/82  01/17/20 134/86

## 2021-08-16 ENCOUNTER — Other Ambulatory Visit: Payer: Self-pay | Admitting: Family Medicine

## 2021-08-16 DIAGNOSIS — I10 Essential (primary) hypertension: Secondary | ICD-10-CM

## 2021-08-18 ENCOUNTER — Other Ambulatory Visit: Payer: Self-pay | Admitting: *Deleted

## 2021-08-18 DIAGNOSIS — I10 Essential (primary) hypertension: Secondary | ICD-10-CM

## 2021-08-18 MED ORDER — CHLORTHALIDONE 25 MG PO TABS: 12.5000 mg | ORAL_TABLET | Freq: Every morning | ORAL | 0 refills | Status: DC

## 2021-08-28 ENCOUNTER — Ambulatory Visit (INDEPENDENT_AMBULATORY_CARE_PROVIDER_SITE_OTHER): Payer: 59 | Admitting: Family Medicine

## 2021-08-28 ENCOUNTER — Other Ambulatory Visit: Payer: Self-pay

## 2021-08-28 VITALS — BP 140/72 | HR 67 | Temp 98.9°F | Ht 66.5 in | Wt 262.2 lb

## 2021-08-28 DIAGNOSIS — Z130 Encounter for screening for diseases of the blood and blood-forming organs and certain disorders involving the immune mechanism: Secondary | ICD-10-CM

## 2021-08-28 DIAGNOSIS — R748 Abnormal levels of other serum enzymes: Secondary | ICD-10-CM

## 2021-08-28 DIAGNOSIS — I10 Essential (primary) hypertension: Secondary | ICD-10-CM

## 2021-08-28 DIAGNOSIS — E782 Mixed hyperlipidemia: Secondary | ICD-10-CM | POA: Diagnosis not present

## 2021-08-28 MED ORDER — ENALAPRIL MALEATE 20 MG PO TABS: ORAL_TABLET | ORAL | 1 refills | Status: DC

## 2021-08-28 MED ORDER — CHLORTHALIDONE 25 MG PO TABS: 12.5000 mg | ORAL_TABLET | Freq: Every morning | ORAL | 1 refills | Status: DC

## 2021-08-28 NOTE — Assessment & Plan Note (Signed)
Repeating level today.  Further evaluation with GGT. ?

## 2021-08-28 NOTE — Assessment & Plan Note (Signed)
Low ASCVD risk currently.  We will continue to monitor.  Advised to watch diet closely. ?

## 2021-08-28 NOTE — Patient Instructions (Signed)
Labs today. ? ?I have refilled your medications. ? ?Follow up later this year for pap smear.  ?  ?Be sure to get your mammogram. ? ?Take care ? ?Dr. Adriana Simas  ?

## 2021-08-28 NOTE — Assessment & Plan Note (Signed)
Stable.  Advised to check BP regularly at home.  Continue chlorthalidone and enalapril.  Refilled today. ?

## 2021-08-28 NOTE — Progress Notes (Signed)
? ?Subjective:  ?Patient ID: Dana Hardy, female    DOB: 1970-12-24  Age: 51 y.o. MRN: [id number] ? ?CC: ?Chief Complaint  ?Patient presents with  ? Establish Care  ? Medication Problem  ?  Needs refills on Enalapril and Low Ogestrel  ? ? ?HPI: ? ?51 year old female with hypertension, hyperlipidemia, elevated alkaline phosphatase presents for follow-up. ? ?Attention is fairly well controlled.  BP was initially elevated and on repeat was improved at 140/72.  I advised her to check her blood pressures at home.  She needs to keep a close eye.  She is currently on enalapril and chlorthalidone.  Labs today. ? ?Patient has longstanding history of elevated alkaline phosphatase.  There has been a prior liver biopsy but this was in 2010.  She has had no further work-up.  Needs labs/work-up.  Denies abdominal pain. ? ?Patient has hyperlipidemia as well.  Last LDL was 132.  Mildly elevated triglycerides.  Patient currently is at low risk, 10-year risk is 2.2%.  She is on no pharmacotherapy at this time. ? ?Patient Active Problem List  ? Diagnosis Date Noted  ? Mixed hyperlipidemia 01/23/2021  ? Elevated alkaline phosphatase level 01/07/2021  ? Essential hypertension, benign 03/16/2013  ? History of abnormal Pap smear 03/16/2013  ? ?Social Hx   ?Social History  ? ?Socioeconomic History  ? Marital status: Married  ?  Spouse name: Not on file  ? Number of children: Not on file  ? Years of education: Not on file  ? Highest education level: Not on file  ?Occupational History  ? Not on file  ?Tobacco Use  ? Smoking status: Never  ? Smokeless tobacco: Never  ?Substance and Sexual Activity  ? Alcohol use: No  ? Drug use: No  ? Sexual activity: Yes  ?  Birth control/protection: Pill  ?Other Topics Concern  ? Not on file  ?Social History Narrative  ? Not on file  ? ?Social Determinants of Health  ? ?Financial Resource Strain: Not on file  ?Food Insecurity: Not on file  ?Transportation Needs: Not on file  ?Physical Activity: Not on  file  ?Stress: Not on file  ?Social Connections: Not on file  ? ? ?Review of Systems  ?Constitutional:   ?     Difficulty losing weight.  ?Respiratory: Negative.    ?Cardiovascular: Negative.   ?Gastrointestinal: Negative.   ? ? ?Objective:  ?BP 140/72   Pulse 67   Temp 98.9 ?F (37.2 ?C) (Oral)   Ht 5' 6.5" (1.689 m)   Wt 262 lb 3.2 oz (118.9 kg)   SpO2 97%   BMI 41.69 kg/m?  ? ?BP/Weight 08/28/2021 01/23/2021 07/24/2020  ?Systolic BP 712 458 099  ?Diastolic BP 72 86 82  ?Wt. (Lbs) 262.2 270 261.6  ?BMI 41.69 42.93 41.59  ? ? ?Physical Exam ?Vitals and nursing note reviewed.  ?Constitutional:   ?   General: She is not in acute distress. ?   Appearance: Normal appearance. She is obese. She is not ill-appearing.  ?HENT:  ?   Head: Normocephalic and atraumatic.  ?Eyes:  ?   General:     ?   Right eye: No discharge.     ?   Left eye: No discharge.  ?   Conjunctiva/sclera: Conjunctivae normal.  ?Cardiovascular:  ?   Rate and Rhythm: Normal rate and regular rhythm.  ?Pulmonary:  ?   Effort: Pulmonary effort is normal.  ?   Breath sounds: Normal breath sounds. No wheezing, rhonchi or rales.  ?  Neurological:  ?   Mental Status: She is alert.  ?Psychiatric:     ?   Mood and Affect: Mood normal.     ?   Behavior: Behavior normal.  ? ? ?Lab Results  ?Component Value Date  ? WBC 8.0 01/17/2020  ? HGB 14.9 01/17/2020  ? HCT 43.6 01/17/2020  ? PLT 287 01/17/2020  ? GLUCOSE 75 10/10/2020  ? CHOL 222 (H) 10/10/2020  ? TRIG 203 (H) 10/10/2020  ? HDL 54 10/10/2020  ? LDLCALC 132 (H) 10/10/2020  ? ALT 16 10/10/2020  ? AST 21 10/10/2020  ? NA 138 10/10/2020  ? K 4.3 10/10/2020  ? CL 98 10/10/2020  ? CREATININE 0.91 10/10/2020  ? BUN 16 10/10/2020  ? CO2 24 10/10/2020  ? TSH 3.250 01/17/2020  ? INR 1.04 04/05/2009  ? ? ? ?Assessment & Plan:  ? ?Problem List Items Addressed This Visit   ? ?  ? Cardiovascular and Mediastinum  ? Essential hypertension, benign - Primary  ?  Stable.  Advised to check BP regularly at home.  Continue  chlorthalidone and enalapril.  Refilled today. ?  ?  ? Relevant Medications  ? enalapril (VASOTEC) 20 MG tablet  ? chlorthalidone (HYGROTON) 25 MG tablet  ? Other Relevant Orders  ? CMP14+EGFR  ?  ? Other  ? Elevated alkaline phosphatase level  ?  Repeating level today.  Further evaluation with GGT. ?  ?  ? Relevant Orders  ? CMP14+EGFR  ? Gamma GT  ? Mixed hyperlipidemia  ?  Low ASCVD risk currently.  We will continue to monitor.  Advised to watch diet closely. ?  ?  ? Relevant Medications  ? enalapril (VASOTEC) 20 MG tablet  ? chlorthalidone (HYGROTON) 25 MG tablet  ? Other Relevant Orders  ? Lipid panel  ? ?Other Visit Diagnoses   ? ? Screening for deficiency anemia      ? Relevant Orders  ? CBC  ? ?  ? ? ?Meds ordered this encounter  ?Medications  ? enalapril (VASOTEC) 20 MG tablet  ?  Sig: TAKE 1 TABLET(20 MG) BY MOUTH DAILY  ?  Dispense:  90 tablet  ?  Refill:  1  ? chlorthalidone (HYGROTON) 25 MG tablet  ?  Sig: Take 0.5 tablets (12.5 mg total) by mouth every morning.  ?  Dispense:  45 tablet  ?  Refill:  1  ? ? ?Follow-up:  Return in about 6 months (around 02/28/2022). ? ?Thersa Salt DO ?[place] ? ?

## 2021-08-29 ENCOUNTER — Other Ambulatory Visit: Payer: Self-pay | Admitting: Family Medicine

## 2021-08-29 DIAGNOSIS — R748 Abnormal levels of other serum enzymes: Secondary | ICD-10-CM

## 2021-08-29 LAB — CBC
Hematocrit: 47.8 % — ABNORMAL HIGH (ref 34.0–46.6)
Hemoglobin: 15.8 g/dL (ref 11.1–15.9)
MCH: 30.2 pg (ref 26.6–33.0)
MCHC: 33.1 g/dL (ref 31.5–35.7)
MCV: 91 fL (ref 79–97)
Platelets: 279 10*3/uL (ref 150–450)
RBC: 5.23 x10E6/uL (ref 3.77–5.28)
RDW: 12.9 % (ref 11.7–15.4)
WBC: 7.4 10*3/uL (ref 3.4–10.8)

## 2021-08-29 LAB — CMP14+EGFR
ALT: 18 IU/L (ref 0–32)
AST: 19 IU/L (ref 0–40)
Albumin/Globulin Ratio: 1.6 (ref 1.2–2.2)
Albumin: 4.4 g/dL (ref 3.8–4.8)
Alkaline Phosphatase: 358 IU/L — ABNORMAL HIGH (ref 44–121)
BUN/Creatinine Ratio: 17 (ref 9–23)
BUN: 15 mg/dL (ref 6–24)
Bilirubin Total: 0.4 mg/dL (ref 0.0–1.2)
CO2: 26 mmol/L (ref 20–29)
Calcium: 9.7 mg/dL (ref 8.7–10.2)
Chloride: 96 mmol/L (ref 96–106)
Creatinine, Ser: 0.88 mg/dL (ref 0.57–1.00)
Globulin, Total: 2.8 g/dL (ref 1.5–4.5)
Glucose: 84 mg/dL (ref 70–99)
Potassium: 4.9 mmol/L (ref 3.5–5.2)
Sodium: 141 mmol/L (ref 134–144)
Total Protein: 7.2 g/dL (ref 6.0–8.5)
eGFR: 81 mL/min/{1.73_m2} (ref >59)

## 2021-08-29 LAB — LIPID PANEL
Chol/HDL Ratio: 4.2 ratio (ref 0.0–4.4)
Cholesterol, Total: 233 mg/dL — ABNORMAL HIGH (ref 100–199)
HDL: 56 mg/dL (ref >39)
LDL Chol Calc (NIH): 140 mg/dL — ABNORMAL HIGH (ref 0–99)
Triglycerides: 207 mg/dL — ABNORMAL HIGH (ref 0–149)
VLDL Cholesterol Cal: 37 mg/dL (ref 5–40)

## 2021-08-29 LAB — GAMMA GT: GGT: 50 IU/L (ref 0–60)

## 2021-09-04 ENCOUNTER — Telehealth: Payer: Self-pay | Admitting: "Endocrinology

## 2021-09-04 NOTE — Telephone Encounter (Signed)
Received referral on patient for Elevated Alkaline Phosphatase Level, Per Dr Fransico Him, this patient would not need to be seen here for this. ?

## 2022-02-18 ENCOUNTER — Other Ambulatory Visit: Payer: Self-pay | Admitting: Family Medicine

## 2022-02-18 DIAGNOSIS — I10 Essential (primary) hypertension: Secondary | ICD-10-CM

## 2022-03-21 IMAGING — MG DIGITAL SCREENING BILAT W/ TOMO W/ CAD
8 series · 8 of 24 positions shown · non-contrast
Comparison: Previous exam(s).

CLINICAL DATA: Screening.

EXAM:
DIGITAL SCREENING BILATERAL MAMMOGRAM WITH TOMO AND CAD

[R MLO synth-2D]
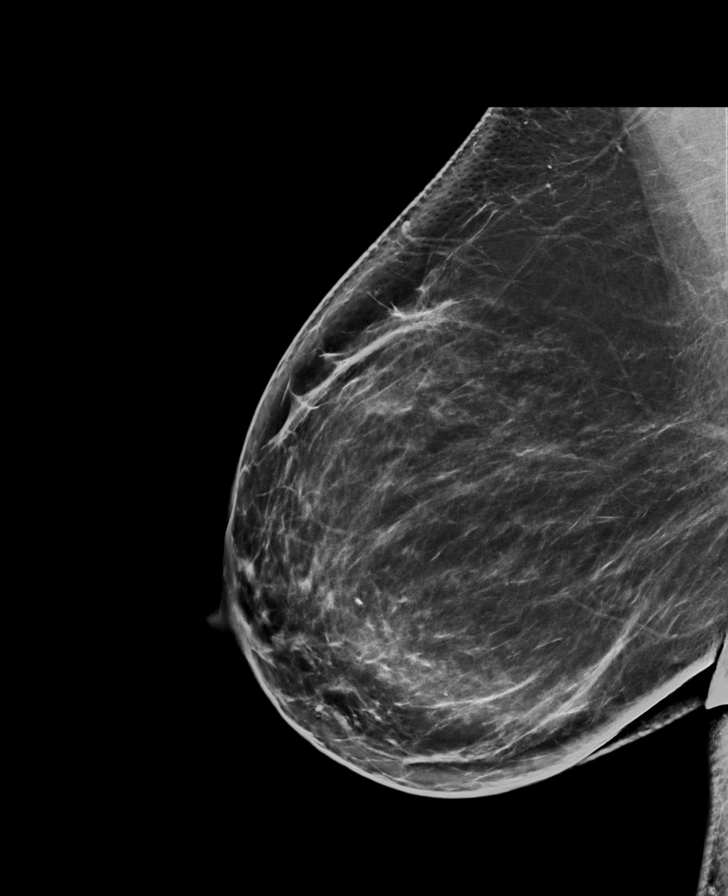

[L MLO synth-2D]
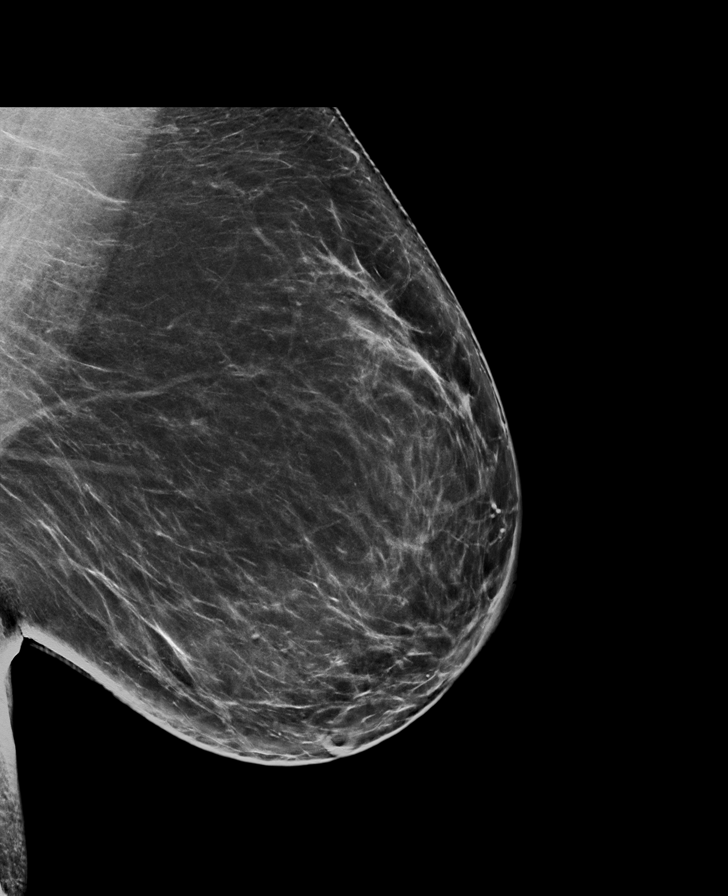

[R CC synth-2D]
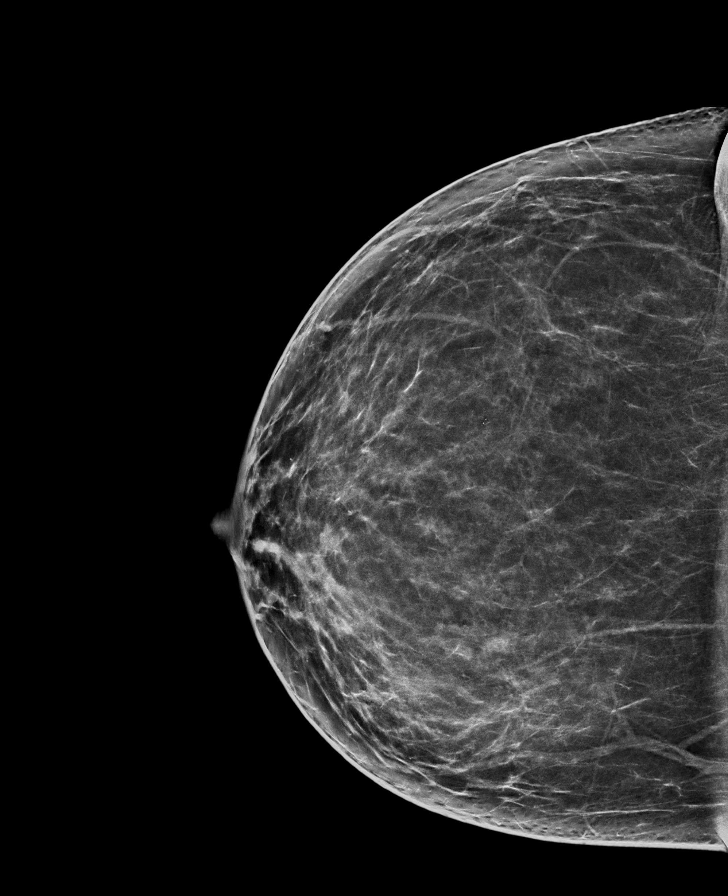

[L CC synth-2D]
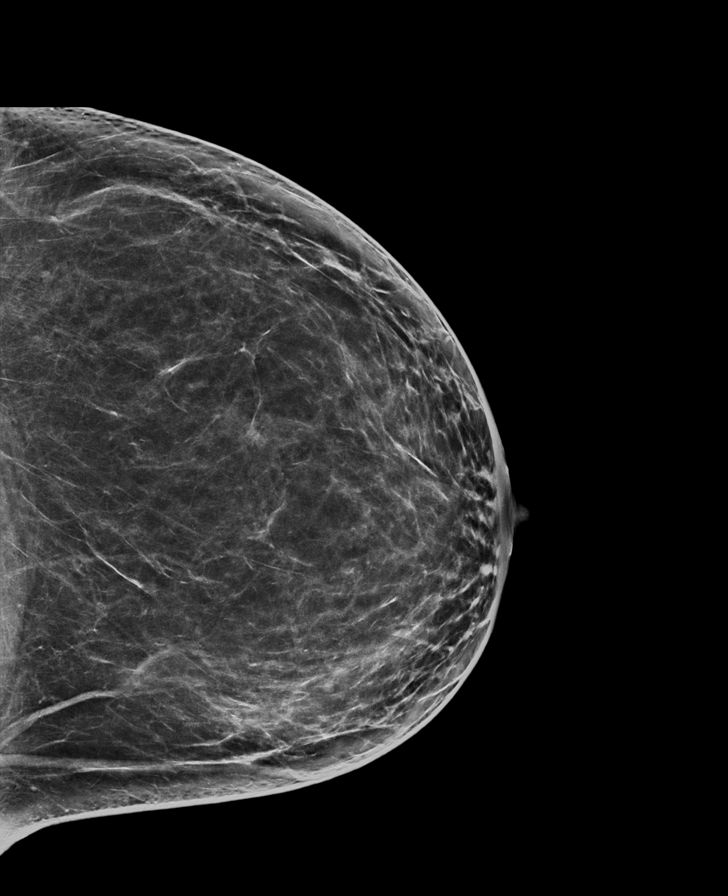

[L MLO tomo · tomo slice 47/93.0]
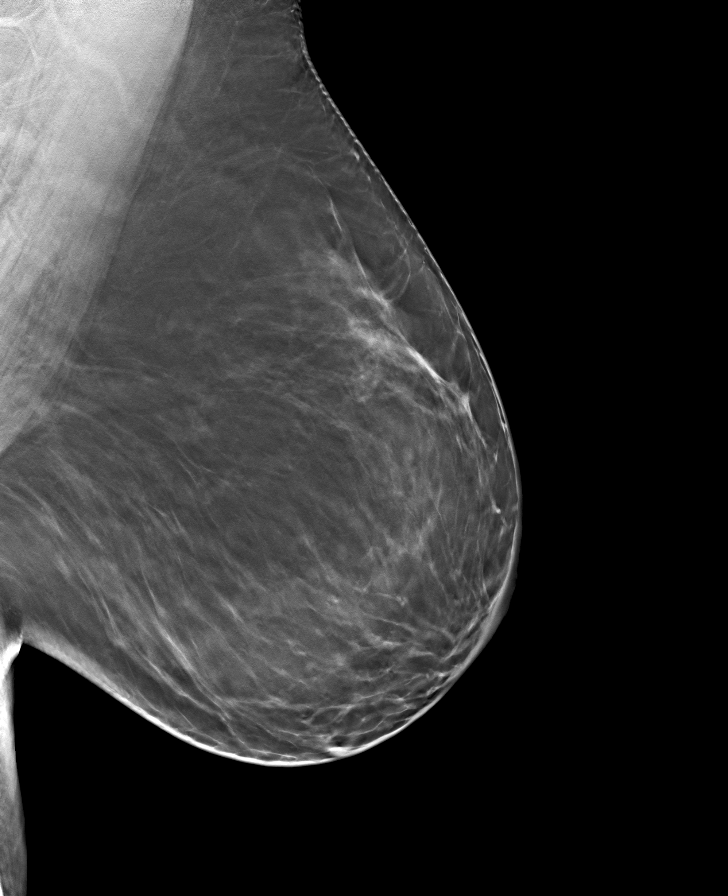

[R MLO tomo · tomo slice 45/89.0]
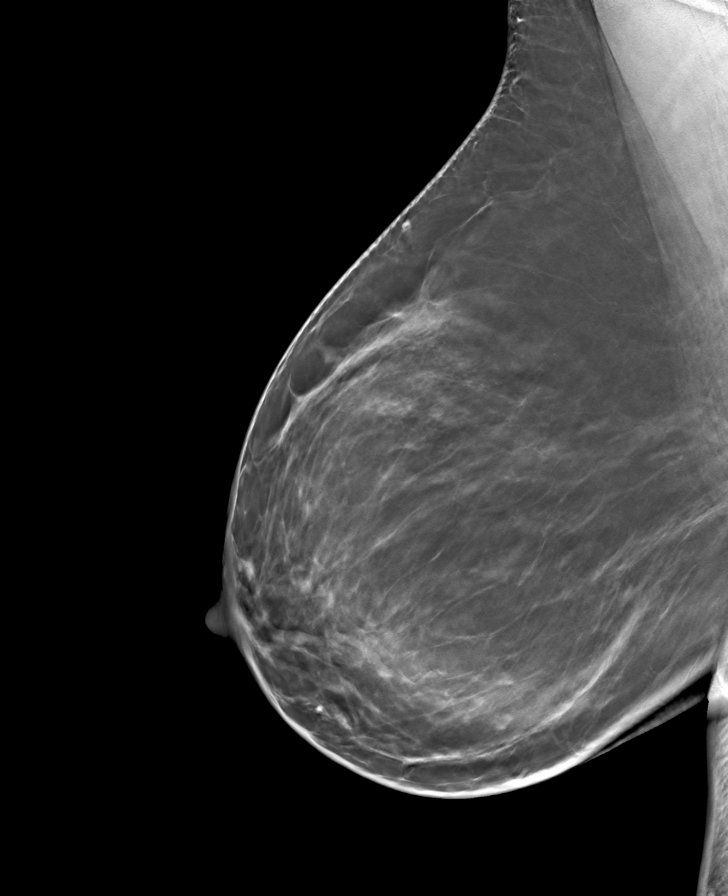

[L CC tomo · tomo slice 41/81.0]
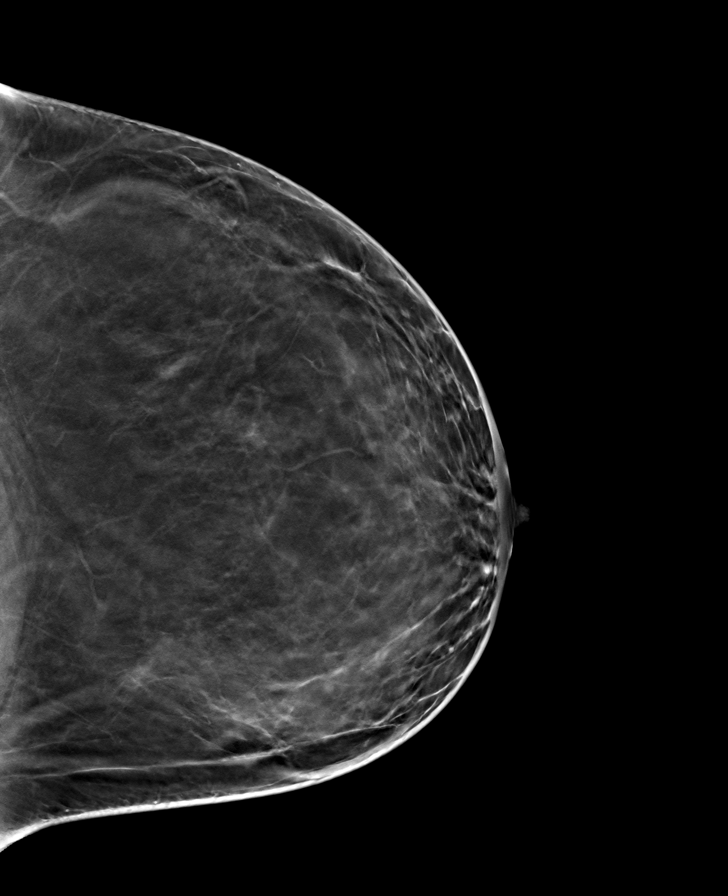

[R CC tomo · tomo slice 39/78.0]
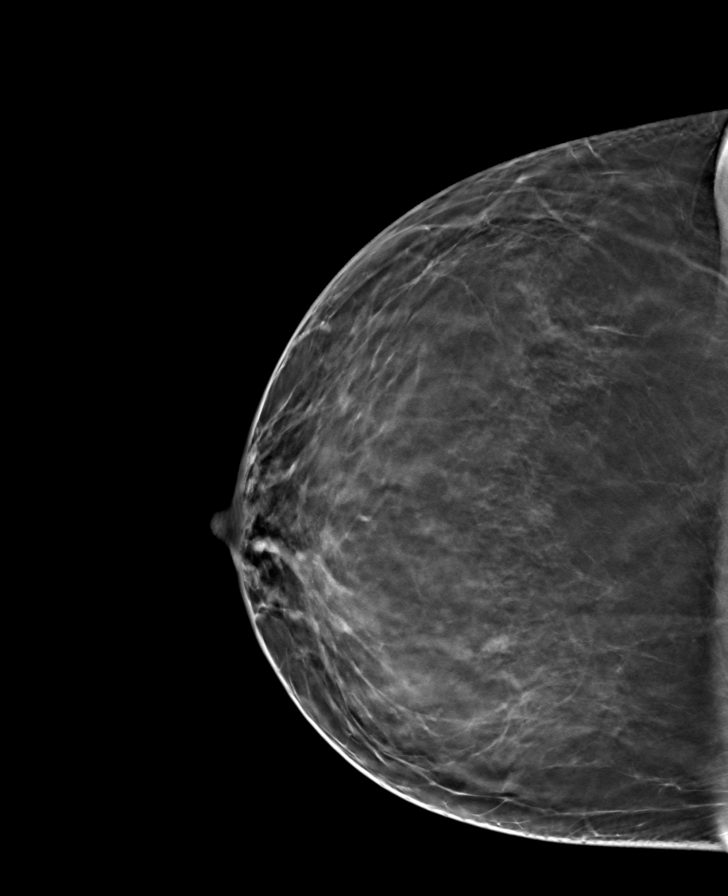

[8 of 24 positions shown; findings below may reference images not displayed]

ACR Breast Density Category b: There are scattered areas of
fibroglandular density.
FINDINGS: There are no findings suspicious for malignancy. Images were
processed with CAD.
IMPRESSION: No mammographic evidence of malignancy. A result letter of this
screening mammogram will be mailed directly to the patient.

RECOMMENDATION:
Screening mammogram in one year. (Code:CN-U-775)

BI-RADS CATEGORY  1: Negative.

## 2022-04-07 ENCOUNTER — Other Ambulatory Visit (HOSPITAL_COMMUNITY): Payer: Self-pay | Admitting: Family Medicine

## 2022-04-07 DIAGNOSIS — Z1231 Encounter for screening mammogram for malignant neoplasm of breast: Secondary | ICD-10-CM

## 2022-04-16 ENCOUNTER — Ambulatory Visit (HOSPITAL_COMMUNITY)
Admission: RE | Admit: 2022-04-16 | Discharge: 2022-04-16 | Disposition: A | Discharge status: Home / Self Care | Payer: 59 | Source: Ambulatory Visit | Attending: Family Medicine | Admitting: Family Medicine

## 2022-04-16 DIAGNOSIS — Z1231 Encounter for screening mammogram for malignant neoplasm of breast: Secondary | ICD-10-CM | POA: Insufficient documentation

## 2022-04-17 ENCOUNTER — Other Ambulatory Visit (HOSPITAL_COMMUNITY): Payer: Self-pay | Admitting: Family Medicine

## 2022-04-17 DIAGNOSIS — R928 Other abnormal and inconclusive findings on diagnostic imaging of breast: Secondary | ICD-10-CM

## 2022-04-21 ENCOUNTER — Ambulatory Visit (HOSPITAL_COMMUNITY)
Admission: RE | Admit: 2022-04-21 | Discharge: 2022-04-21 | Disposition: A | Discharge status: Home / Self Care | Payer: 59 | Source: Ambulatory Visit | Attending: Family Medicine | Admitting: Family Medicine

## 2022-04-21 DIAGNOSIS — R928 Other abnormal and inconclusive findings on diagnostic imaging of breast: Secondary | ICD-10-CM | POA: Insufficient documentation

## 2022-04-21 DIAGNOSIS — R922 Inconclusive mammogram: Secondary | ICD-10-CM | POA: Diagnosis not present

## 2022-05-12 ENCOUNTER — Other Ambulatory Visit: Payer: Self-pay | Admitting: Family Medicine

## 2022-05-12 DIAGNOSIS — I10 Essential (primary) hypertension: Secondary | ICD-10-CM

## 2022-05-23 ENCOUNTER — Other Ambulatory Visit: Payer: Self-pay | Admitting: Family Medicine

## 2022-05-23 DIAGNOSIS — I10 Essential (primary) hypertension: Secondary | ICD-10-CM

## 2022-07-28 ENCOUNTER — Other Ambulatory Visit: Payer: Self-pay | Admitting: Family Medicine

## 2022-07-28 ENCOUNTER — Other Ambulatory Visit: Payer: Self-pay

## 2022-07-28 DIAGNOSIS — I10 Essential (primary) hypertension: Secondary | ICD-10-CM

## 2022-07-28 MED ORDER — ENALAPRIL MALEATE 20 MG PO TABS: 20.0000 mg | ORAL_TABLET | Freq: Every day | ORAL | 1 refills | Status: DC

## 2023-01-31 ENCOUNTER — Other Ambulatory Visit: Payer: Self-pay | Admitting: Family Medicine

## 2023-01-31 DIAGNOSIS — I10 Essential (primary) hypertension: Secondary | ICD-10-CM

## 2023-02-09 ENCOUNTER — Other Ambulatory Visit: Payer: Self-pay | Admitting: Family Medicine

## 2023-02-09 DIAGNOSIS — I10 Essential (primary) hypertension: Secondary | ICD-10-CM

## 2023-03-11 ENCOUNTER — Encounter: Payer: Self-pay | Admitting: Family Medicine

## 2023-03-11 ENCOUNTER — Ambulatory Visit (INDEPENDENT_AMBULATORY_CARE_PROVIDER_SITE_OTHER): Payer: 59 | Admitting: Family Medicine

## 2023-03-11 VITALS — BP 136/78 | HR 59 | Temp 98.6°F | Wt 258.7 lb

## 2023-03-11 DIAGNOSIS — Z130 Encounter for screening for diseases of the blood and blood-forming organs and certain disorders involving the immune mechanism: Secondary | ICD-10-CM

## 2023-03-11 DIAGNOSIS — E782 Mixed hyperlipidemia: Secondary | ICD-10-CM | POA: Diagnosis not present

## 2023-03-11 DIAGNOSIS — R748 Abnormal levels of other serum enzymes: Secondary | ICD-10-CM

## 2023-03-11 DIAGNOSIS — I10 Essential (primary) hypertension: Secondary | ICD-10-CM | POA: Diagnosis not present

## 2023-03-11 MED ORDER — CHLORTHALIDONE 25 MG PO TABS: 25.0000 mg | ORAL_TABLET | Freq: Every day | ORAL | 3 refills | Status: DC

## 2023-03-11 MED ORDER — ENALAPRIL MALEATE 20 MG PO TABS: 20.0000 mg | ORAL_TABLET | Freq: Every day | ORAL | 3 refills | Status: DC

## 2023-03-11 NOTE — Assessment & Plan Note (Signed)
Stable.  Continue current medications.  Refilled today. 

## 2023-03-11 NOTE — Assessment & Plan Note (Signed)
Etiology unclear at this time.  Normal GGT.  Further evaluation today with repeat alkaline phosphatase as well as alkaline phosphatase isoenzymes.

## 2023-03-11 NOTE — Progress Notes (Signed)
Subjective:  Patient ID: Dana Hardy, female    DOB: 23-Apr-1971  Age: 52 y.o. MRN: [id number]  CC: Follow up   HPI:  52 year old female with hypertension, hyperlipidemia, elevated phosphatase presents for follow-up  Patient's hypertension is stable.  Needs refill of chlorthalidone and enalapril.  Needs labs.  Lipids uncontrolled.  Needs reassessment today.  Patient has had persistently elevated alkaline phosphatase.  GGT normal.  Referral was placed to endocrinology.  She was not seen apparently.    Declines flu vaccine today.  She states that she has Cologuard at home.  Advised that she needs to get Pap smear done.  Patient Active Problem List   Diagnosis Date Noted   Mixed hyperlipidemia 01/23/2021   Elevated alkaline phosphatase level 01/07/2021   Essential hypertension, benign 03/16/2013   History of abnormal Pap smear 03/16/2013    Social Hx   Social History   Socioeconomic History   Marital status: Married    Spouse name: Not on file   Number of children: Not on file   Years of education: Not on file   Highest education level: Not on file  Occupational History   Not on file  Tobacco Use   Smoking status: Never   Smokeless tobacco: Never  Substance and Sexual Activity   Alcohol use: No   Drug use: No   Sexual activity: Yes    Birth control/protection: Pill  Other Topics Concern   Not on file  Social History Narrative   Not on file   Social Determinants of Health   Financial Resource Strain: Not on file  Food Insecurity: Not on file  Transportation Needs: Not on file  Physical Activity: Not on file  Stress: Not on file  Social Connections: Not on file    Review of Systems  Respiratory: Negative.    Cardiovascular: Negative.    Objective:  BP 136/78   Pulse (!) 59   Temp 98.6 F (37 C)   Wt 258 lb 11.2 oz (117.3 kg)   LMP 12/28/2022   SpO2 99%   BMI 41.13 kg/m      03/11/2023    9:48 AM 03/11/2023    9:42 AM 08/28/2021    8:54 AM   BP/Weight  Systolic BP 136 159 140  Diastolic BP 78 82 72  Wt. (Lbs)  258.7   BMI  41.13 kg/m2     Physical Exam Vitals reviewed.  Constitutional:      General: She is not in acute distress.    Appearance: Normal appearance.  HENT:     Head: Normocephalic and atraumatic.  Eyes:     General:        Right eye: No discharge.        Left eye: No discharge.     Conjunctiva/sclera: Conjunctivae normal.  Cardiovascular:     Rate and Rhythm: Normal rate and regular rhythm.  Pulmonary:     Effort: Pulmonary effort is normal.     Breath sounds: Normal breath sounds. No wheezing, rhonchi or rales.  Neurological:     Mental Status: She is alert.  Psychiatric:        Mood and Affect: Mood normal.        Behavior: Behavior normal.     Lab Results  Component Value Date   WBC 7.4 08/28/2021   HGB 15.8 08/28/2021   HCT 47.8 (H) 08/28/2021   PLT 279 08/28/2021   GLUCOSE 84 08/28/2021   CHOL 233 (H) 08/28/2021   TRIG 207 (  H) 08/28/2021   HDL 56 08/28/2021   LDLCALC 140 (H) 08/28/2021   ALT 18 08/28/2021   AST 19 08/28/2021   NA 141 08/28/2021   K 4.9 08/28/2021   CL 96 08/28/2021   CREATININE 0.88 08/28/2021   BUN 15 08/28/2021   CO2 26 08/28/2021   TSH 3.250 01/17/2020   INR 1.04 04/05/2009     Assessment & Plan:   Problem List Items Addressed This Visit       Cardiovascular and Mediastinum   Essential hypertension, benign - Primary    Stable.  Continue current medications.  Refilled today.      Relevant Medications   chlorthalidone (HYGROTON) 25 MG tablet   enalapril (VASOTEC) 20 MG tablet     Other   Mixed hyperlipidemia    Unsure of control.  Assessing lipids today with lipid panel.      Relevant Medications   chlorthalidone (HYGROTON) 25 MG tablet   enalapril (VASOTEC) 20 MG tablet   Other Relevant Orders   Lipid panel   Elevated alkaline phosphatase level    Etiology unclear at this time.  Normal GGT.  Further evaluation today with repeat alkaline  phosphatase as well as alkaline phosphatase isoenzymes.      Relevant Orders   CMP14+EGFR   Alkaline Phosphatase, Isoenzymes   Other Visit Diagnoses     Screening for deficiency anemia       Relevant Orders   CBC       Meds ordered this encounter  Medications   chlorthalidone (HYGROTON) 25 MG tablet    Sig: Take 1 tablet (25 mg total) by mouth daily. PLEASE SCHEDULE APPT    Dispense:  90 tablet    Refill:  3   enalapril (VASOTEC) 20 MG tablet    Sig: Take 1 tablet (20 mg total) by mouth daily.    Dispense:  90 tablet    Refill:  3    Follow-up:  6 months  Tuwanda Vokes Adriana Simas DO [facility]

## 2023-03-11 NOTE — Assessment & Plan Note (Signed)
Unsure of control.  Assessing lipids today with lipid panel.

## 2023-03-11 NOTE — Patient Instructions (Signed)
Labs today.  Medications refilled.  Follow up in 6 months. 

## 2023-03-14 LAB — CMP14+EGFR
ALT: 33 IU/L — ABNORMAL HIGH (ref 0–32)
AST: 32 IU/L (ref 0–40)
Albumin: 4.2 g/dL (ref 3.8–4.9)
Alkaline Phosphatase: 220 IU/L — ABNORMAL HIGH (ref 44–121)
BUN/Creatinine Ratio: 13 (ref 9–23)
BUN: 11 mg/dL (ref 6–24)
Bilirubin Total: 0.6 mg/dL (ref 0.0–1.2)
CO2: 26 mmol/L (ref 20–29)
Calcium: 9.6 mg/dL (ref 8.7–10.2)
Chloride: 98 mmol/L (ref 96–106)
Creatinine, Ser: 0.84 mg/dL (ref 0.57–1.00)
Globulin, Total: 2.8 g/dL (ref 1.5–4.5)
Glucose: 78 mg/dL (ref 70–99)
Potassium: 4 mmol/L (ref 3.5–5.2)
Sodium: 137 mmol/L (ref 134–144)
Total Protein: 7 g/dL (ref 6.0–8.5)
eGFR: 84 mL/min/{1.73_m2} (ref >59)

## 2023-03-14 LAB — CBC
Hematocrit: 42.8 % (ref 34.0–46.6)
Hemoglobin: 14.6 g/dL (ref 11.1–15.9)
MCH: 31.2 pg (ref 26.6–33.0)
MCHC: 34.1 g/dL (ref 31.5–35.7)
MCV: 92 fL (ref 79–97)
Platelets: 227 10*3/uL (ref 150–450)
RBC: 4.68 x10E6/uL (ref 3.77–5.28)
RDW: 12.7 % (ref 11.7–15.4)
WBC: 6.2 10*3/uL (ref 3.4–10.8)

## 2023-03-14 LAB — LIPID PANEL
Chol/HDL Ratio: 3.4 ratio (ref 0.0–4.4)
Cholesterol, Total: 195 mg/dL (ref 100–199)
HDL: 58 mg/dL (ref >39)
LDL Chol Calc (NIH): 112 mg/dL — ABNORMAL HIGH (ref 0–99)
Triglycerides: 141 mg/dL (ref 0–149)
VLDL Cholesterol Cal: 25 mg/dL (ref 5–40)

## 2023-03-14 LAB — ALKALINE PHOSPHATASE, ISOENZYMES
BONE FRACTION: 35 % (ref 14–68)
INTESTINAL FRAC.: 1 % (ref 0–18)
LIVER FRACTION: 64 % (ref 18–85)

## 2023-04-02 ENCOUNTER — Other Ambulatory Visit: Payer: Self-pay | Admitting: Family Medicine

## 2023-04-02 DIAGNOSIS — Z1211 Encounter for screening for malignant neoplasm of colon: Secondary | ICD-10-CM

## 2023-04-02 DIAGNOSIS — Z1212 Encounter for screening for malignant neoplasm of rectum: Secondary | ICD-10-CM

## 2023-04-05 ENCOUNTER — Encounter: Payer: Self-pay | Admitting: Family Medicine

## 2023-04-05 ENCOUNTER — Other Ambulatory Visit: Payer: Self-pay

## 2023-04-07 ENCOUNTER — Telehealth: Payer: Self-pay | Admitting: Family Medicine

## 2023-04-07 NOTE — Telephone Encounter (Signed)
Patient is requesting that no order for colo guard be sent in until she is able to check with her insurance company to see if they will pay for it.

## 2023-04-07 NOTE — Telephone Encounter (Signed)
Dana Hardy R, LPN     Patient is not agreeable to bulk cologuard order

## 2023-04-08 NOTE — Telephone Encounter (Signed)
Tommie Sams, DO     Go ahead and order.  Thank you

## 2023-04-09 NOTE — Telephone Encounter (Signed)
Cook, Gilliam G, DO     No. I'll discuss it with her at follow up. Sorry from the confusion. I read it wrong.

## 2023-04-09 NOTE — Telephone Encounter (Signed)
It says she is not agreeable- do I still order?

## 2023-04-14 ENCOUNTER — Other Ambulatory Visit (HOSPITAL_COMMUNITY): Payer: Self-pay | Admitting: Family Medicine

## 2023-04-14 DIAGNOSIS — Z1231 Encounter for screening mammogram for malignant neoplasm of breast: Secondary | ICD-10-CM

## 2023-04-22 ENCOUNTER — Ambulatory Visit (HOSPITAL_COMMUNITY)
Admission: RE | Admit: 2023-04-22 | Discharge: 2023-04-22 | Disposition: A | Discharge status: Home / Self Care | Payer: 59 | Source: Ambulatory Visit | Attending: Family Medicine | Admitting: Family Medicine

## 2023-04-22 ENCOUNTER — Encounter (HOSPITAL_COMMUNITY): Payer: Self-pay | Admitting: Radiology

## 2023-04-22 DIAGNOSIS — Z1231 Encounter for screening mammogram for malignant neoplasm of breast: Secondary | ICD-10-CM | POA: Insufficient documentation

## 2024-03-03 ENCOUNTER — Other Ambulatory Visit: Payer: Self-pay | Admitting: Family Medicine

## 2024-03-03 DIAGNOSIS — I10 Essential (primary) hypertension: Secondary | ICD-10-CM

## 2024-03-24 ENCOUNTER — Ambulatory Visit: Admitting: Family Medicine

## 2024-04-07 ENCOUNTER — Ambulatory Visit (INDEPENDENT_AMBULATORY_CARE_PROVIDER_SITE_OTHER): Admitting: Family Medicine

## 2024-04-07 ENCOUNTER — Encounter: Payer: Self-pay | Admitting: Family Medicine

## 2024-04-07 VITALS — BP 130/76 | HR 59 | Ht 66.0 in | Wt 242.0 lb

## 2024-04-07 DIAGNOSIS — Z130 Encounter for screening for diseases of the blood and blood-forming organs and certain disorders involving the immune mechanism: Secondary | ICD-10-CM | POA: Diagnosis not present

## 2024-04-07 DIAGNOSIS — Z1211 Encounter for screening for malignant neoplasm of colon: Secondary | ICD-10-CM | POA: Diagnosis not present

## 2024-04-07 DIAGNOSIS — E782 Mixed hyperlipidemia: Secondary | ICD-10-CM | POA: Diagnosis not present

## 2024-04-07 DIAGNOSIS — Z23 Encounter for immunization: Secondary | ICD-10-CM

## 2024-04-07 DIAGNOSIS — I10 Essential (primary) hypertension: Secondary | ICD-10-CM

## 2024-04-07 MED ORDER — COVID-19 MRNA VAC-TRIS(PFIZER) 30 MCG/0.3ML IM SUSY: 0.3000 mL | PREFILLED_SYRINGE | Freq: Once | INTRAMUSCULAR | 0 refills | Status: AC

## 2024-04-07 NOTE — Patient Instructions (Signed)
 Labs today.  Cologuard ordered.  Schedule your pap smear. Call [phone] to schedule mammogram.  Follow up in 6 months to 1 year.  Take care  Dr. Bluford

## 2024-04-09 ENCOUNTER — Ambulatory Visit: Payer: Self-pay | Admitting: Family Medicine

## 2024-04-09 DIAGNOSIS — Z1211 Encounter for screening for malignant neoplasm of colon: Secondary | ICD-10-CM

## 2024-04-09 LAB — LIPID PANEL
Chol/HDL Ratio: 3.3 ratio (ref 0.0–4.4)
Cholesterol, Total: 176 mg/dL (ref 100–199)
HDL: 54 mg/dL (ref >39)
LDL Chol Calc (NIH): 99 mg/dL (ref 0–99)
Triglycerides: 129 mg/dL (ref 0–149)
VLDL Cholesterol Cal: 23 mg/dL (ref 5–40)

## 2024-04-09 LAB — CMP14+EGFR
ALT: 29 IU/L (ref 0–32)
AST: 25 IU/L (ref 0–40)
Albumin: 4.3 g/dL (ref 3.8–4.9)
Alkaline Phosphatase: 188 IU/L — ABNORMAL HIGH (ref 49–135)
BUN/Creatinine Ratio: 19 (ref 9–23)
BUN: 15 mg/dL (ref 6–24)
Bilirubin Total: 0.6 mg/dL (ref 0.0–1.2)
CO2: 27 mmol/L (ref 20–29)
Calcium: 9.4 mg/dL (ref 8.7–10.2)
Chloride: 98 mmol/L (ref 96–106)
Creatinine, Ser: 0.8 mg/dL (ref 0.57–1.00)
Globulin, Total: 2.8 g/dL (ref 1.5–4.5)
Glucose: 81 mg/dL (ref 70–99)
Potassium: 4 mmol/L (ref 3.5–5.2)
Sodium: 140 mmol/L (ref 134–144)
Total Protein: 7.1 g/dL (ref 6.0–8.5)
eGFR: 89 mL/min/1.73 (ref >59)

## 2024-04-09 LAB — CBC
Hematocrit: 43.4 % (ref 34.0–46.6)
Hemoglobin: 14 g/dL (ref 11.1–15.9)
MCH: 30.5 pg (ref 26.6–33.0)
MCHC: 32.3 g/dL (ref 31.5–35.7)
MCV: 95 fL (ref 79–97)
Platelets: 281 x10E3/uL (ref 150–450)
RBC: 4.59 x10E6/uL (ref 3.77–5.28)
RDW: 12.8 % (ref 11.7–15.4)
WBC: 6.7 x10E3/uL (ref 3.4–10.8)

## 2024-04-09 LAB — MICROALBUMIN / CREATININE URINE RATIO
Creatinine, Urine: 185.9 mg/dL
Microalb/Creat Ratio: 14 mg/g{creat} (ref 0–29)
Microalbumin, Urine: 25.8 ug/mL

## 2024-04-09 MED ORDER — CHLORTHALIDONE 25 MG PO TABS: 25.0000 mg | ORAL_TABLET | Freq: Every day | ORAL | 3 refills | Status: AC

## 2024-04-09 MED ORDER — ENALAPRIL MALEATE 20 MG PO TABS: 20.0000 mg | ORAL_TABLET | Freq: Every day | ORAL | 3 refills | Status: AC

## 2024-04-09 NOTE — Progress Notes (Signed)
 Subjective:  Patient ID: Dana Hardy, female    DOB: 1970-11-17  Age: 53 y.o. MRN: [id number]  CC:   Chief Complaint  Patient presents with   Hypertension    Follow up    HPI:  53 year old female presents for follow up.  Feeling well. Reports that she is no longer in the [employer].  HTN stable on chlorthalidone  and enalapril .  Needs labs today.  Amendable to Cologuard and Flu vaccine today. Desires COVID vaccine (needs Rx).  Needs mammogram. Needs pap smear.   Patient Active Problem List   Diagnosis Date Noted   Mixed hyperlipidemia 01/23/2021   Elevated alkaline phosphatase level 01/07/2021   Essential hypertension, benign 03/16/2013   History of abnormal Pap smear 03/16/2013    Social Hx   Social History   Socioeconomic History   Marital status: Married    Spouse name: Not on file   Number of children: Not on file   Years of education: Not on file   Highest education level: Not on file  Occupational History   Not on file  Tobacco Use   Smoking status: Never   Smokeless tobacco: Never  Substance and Sexual Activity   Alcohol use: No   Drug use: No   Sexual activity: Yes    Birth control/protection: Pill  Other Topics Concern   Not on file  Social History Narrative   Not on file   Social Drivers of Health   Financial Resource Strain: Not on file  Food Insecurity: Not on file  Transportation Needs: Not on file  Physical Activity: Not on file  Stress: Not on file  Social Connections: Not on file    Review of Systems Per HPI  Objective:  BP 130/76   Pulse (!) 59   Ht 5' 6 (1.676 m)   Wt 242 lb (109.8 kg)   SpO2 98%   BMI 39.06 kg/m      04/07/2024    9:43 AM 03/11/2023    9:48 AM 03/11/2023    9:42 AM  BP/Weight  Systolic BP 130 136 159  Diastolic BP 76 78 82  Wt. (Lbs) 242  258.7  BMI 39.06 kg/m2  41.13 kg/m2    Physical Exam Vitals and nursing note reviewed.  Constitutional:      General: She is not in acute  distress.    Appearance: Normal appearance. She is obese.  HENT:     Head: Normocephalic and atraumatic.  Eyes:     General:        Right eye: No discharge.        Left eye: No discharge.     Conjunctiva/sclera: Conjunctivae normal.  Cardiovascular:     Rate and Rhythm: Normal rate and regular rhythm.  Pulmonary:     Effort: Pulmonary effort is normal.     Breath sounds: Normal breath sounds. No wheezing, rhonchi or rales.  Neurological:     Mental Status: She is alert.  Psychiatric:        Mood and Affect: Mood normal.        Behavior: Behavior normal.     Lab Results  Component Value Date   WBC 6.7 04/07/2024   HGB 14.0 04/07/2024   HCT 43.4 04/07/2024   PLT 281 04/07/2024   GLUCOSE 81 04/07/2024   CHOL 176 04/07/2024   TRIG 129 04/07/2024   HDL 54 04/07/2024   LDLCALC 99 04/07/2024   ALT 29 04/07/2024   AST 25 04/07/2024  NA 140 04/07/2024   K 4.0 04/07/2024   CL 98 04/07/2024   CREATININE 0.80 04/07/2024   BUN 15 04/07/2024   CO2 27 04/07/2024   TSH 3.250 01/17/2020   INR 1.04 04/05/2009     Assessment & Plan:  Essential hypertension, benign Assessment & Plan: Stable. Labs today. Med refills sent in.  Orders: -     CMP14+EGFR -     Microalbumin / creatinine urine ratio -     Chlorthalidone ; Take 1 tablet (25 mg total) by mouth daily. Appointment required for future refills  Dispense: 90 tablet; Refill: 3 -     Enalapril  Maleate; Take 1 tablet (20 mg total) by mouth daily. Appointment required  Dispense: 90 tablet; Refill: 3  Colon cancer screening -     Cologuard  Screening for deficiency anemia -     CBC  Mixed hyperlipidemia -     Lipid panel  Encounter for immunization -     Flu vaccine trivalent PF, 6mos and older(Flulaval,Afluria,Fluarix,Fluzone)  Other orders -     COVID-19 mRNA Vac-TriS(Pfizer); Inject 0.3 mLs into the muscle once for 1 dose.  Dispense: 0.3 mL; Refill: 0    Follow-up:  6 months to 1 year  Adhvik Canady  DO [facility]

## 2024-04-09 NOTE — Assessment & Plan Note (Signed)
 Stable. Labs today. Med refills sent in.

## 2024-04-17 ENCOUNTER — Other Ambulatory Visit (HOSPITAL_COMMUNITY): Payer: Self-pay | Admitting: Family Medicine

## 2024-04-17 DIAGNOSIS — Z1231 Encounter for screening mammogram for malignant neoplasm of breast: Secondary | ICD-10-CM

## 2024-04-24 LAB — COLOGUARD: COLOGUARD: POSITIVE — AB

## 2024-05-01 ENCOUNTER — Encounter (INDEPENDENT_AMBULATORY_CARE_PROVIDER_SITE_OTHER): Payer: Self-pay | Admitting: *Deleted

## 2024-05-04 ENCOUNTER — Other Ambulatory Visit (HOSPITAL_COMMUNITY)
Admission: RE | Admit: 2024-05-04 | Discharge: 2024-05-04 | Disposition: A | Discharge status: Home / Self Care | Source: Ambulatory Visit | Attending: Physician Assistant | Admitting: Physician Assistant

## 2024-05-04 ENCOUNTER — Encounter (HOSPITAL_COMMUNITY): Payer: Self-pay

## 2024-05-04 ENCOUNTER — Encounter: Payer: Self-pay | Admitting: Physician Assistant

## 2024-05-04 ENCOUNTER — Ambulatory Visit (INDEPENDENT_AMBULATORY_CARE_PROVIDER_SITE_OTHER): Admitting: Physician Assistant

## 2024-05-04 ENCOUNTER — Ambulatory Visit (HOSPITAL_COMMUNITY)
Admission: RE | Admit: 2024-05-04 | Discharge: 2024-05-04 | Disposition: A | Discharge status: Home / Self Care | Source: Ambulatory Visit | Attending: Family Medicine | Admitting: Family Medicine

## 2024-05-04 VITALS — BP 126/81 | HR 50 | Temp 97.9°F | Ht 66.0 in | Wt 241.8 lb

## 2024-05-04 DIAGNOSIS — Z124 Encounter for screening for malignant neoplasm of cervix: Secondary | ICD-10-CM | POA: Insufficient documentation

## 2024-05-04 DIAGNOSIS — Z1231 Encounter for screening mammogram for malignant neoplasm of breast: Secondary | ICD-10-CM | POA: Insufficient documentation

## 2024-05-04 DIAGNOSIS — Z01419 Encounter for gynecological examination (general) (routine) without abnormal findings: Secondary | ICD-10-CM | POA: Diagnosis not present

## 2024-05-04 DIAGNOSIS — Z1151 Encounter for screening for human papillomavirus (HPV): Secondary | ICD-10-CM | POA: Diagnosis not present

## 2024-05-04 NOTE — Assessment & Plan Note (Signed)
 Immunizations reviewed: up to date  Diet and exercise/ lifestyle modifications discussed: Recommend 150 minutes per week of exercise such as walking. Recommend lots of fresh produce to include fruits, vegetables, beans, healthy fats such as avocado, nuts, seeds, and 3-6 ounces of protein at each meal.  Avoid fried foods and fast food. Limit alcohol consumption: no more than one drink per day for women and 2 drinks per day for men.  Stress management discussed. Routine vision and dental screening discussed: recommend dentist every 6 months, gets vision checked every 1-2 years.  Health maintenance Pap obtained today, patient scheduled for mammogram later today. Questions answered.

## 2024-05-04 NOTE — Progress Notes (Signed)
 Established Patient Office Visit  Subjective   Patient ID: Dana Hardy, female    DOB: Aug 07, 1970  Age: 53 y.o. MRN: [id number]  Chief Complaint  Patient presents with   Gynecologic Exam    Patient is here for a PAP Smear. She said she did not have any concerns.  She mentioned after this appointment she will be getting her mammogram done.     Patient presents today for annual gynecologic exam and discuss recent lab results. She reports distant history of abnormal pap with LEEP procedure many years ago. She denies current symptoms such as vaginal discharge, bleeding, or odor. We reviewed blood work for 10/10. She has an upcoming GI appointment to discuss abnormal Cologuard results.    Review of Systems  Constitutional:  Negative for chills, fever and malaise/fatigue.  Eyes:  Negative for blurred vision and double vision.  Respiratory:  Negative for cough and shortness of breath.   Cardiovascular:  Negative for chest pain and palpitations.  Genitourinary:  Negative for dysuria.  Neurological:  Negative for dizziness and headaches.      Objective:     BP 126/81   Pulse (!) 50   Temp 97.9 F (36.6 C)   Ht 5' 6 (1.676 m)   Wt 241 lb 12 oz (109.7 kg)   SpO2 100%   BMI 39.02 kg/m    Physical Exam Constitutional:      General: She is not in acute distress.    Appearance: Normal appearance. She is obese. She is not ill-appearing.  HENT:     Head: Normocephalic and atraumatic.     Mouth/Throat:     Mouth: Mucous membranes are moist.     Pharynx: Oropharynx is clear.  Eyes:     Extraocular Movements: Extraocular movements intact.     Conjunctiva/sclera: Conjunctivae normal.  Cardiovascular:     Rate and Rhythm: Normal rate and regular rhythm.     Heart sounds: Normal heart sounds. No murmur heard. Pulmonary:     Effort: Pulmonary effort is normal.     Breath sounds: Normal breath sounds.  Genitourinary:    Pubic Area: No rash.      Labia:        Right: No  rash, tenderness or lesion.        Left: No rash, tenderness or lesion.      Vagina: No vaginal discharge, erythema, tenderness or bleeding.     Cervix: Discharge present. No cervical motion tenderness, lesion, erythema or cervical bleeding.     Comments: Chaperone deferred Musculoskeletal:     Right lower leg: No edema.     Left lower leg: No edema.  Skin:    General: Skin is warm and dry.  Neurological:     General: No focal deficit present.     Mental Status: She is alert and oriented to person, place, and time.  Psychiatric:        Mood and Affect: Mood normal.        Behavior: Behavior normal.     No results found for any visits on 05/04/24.  The 10-year ASCVD risk score (Arnett DK, et al., 2019) is: 1.7%    Assessment & Plan:   Return as scheudled with Dr. Bluford.   Encounter for annual routine gynecological examination Assessment & Plan: Immunizations reviewed: up to date  Diet and exercise/ lifestyle modifications discussed: Recommend 150 minutes per week of exercise such as walking. Recommend lots of fresh produce to include fruits, vegetables, beans, healthy  fats such as avocado, nuts, seeds, and 3-6 ounces of protein at each meal.  Avoid fried foods and fast food. Limit alcohol consumption: no more than one drink per day for women and 2 drinks per day for men.  Stress management discussed. Routine vision and dental screening discussed: recommend dentist every 6 months, gets vision checked every 1-2 years.  Health maintenance Pap obtained today, patient scheduled for mammogram later today. Questions answered.    Screening for cervical cancer -     Cytology - PAP  Screening for human papillomavirus (HPV) -     Cytology - PAP    Maybelle Depaoli, PA-C

## 2024-05-05 ENCOUNTER — Ambulatory Visit: Payer: Self-pay | Admitting: Physician Assistant

## 2024-05-05 LAB — CYTOLOGY - PAP
Comment: NEGATIVE
Diagnosis: NEGATIVE
High risk HPV: NEGATIVE

## 2024-05-09 ENCOUNTER — Other Ambulatory Visit (HOSPITAL_COMMUNITY): Payer: Self-pay | Admitting: Family Medicine

## 2024-05-09 DIAGNOSIS — R928 Other abnormal and inconclusive findings on diagnostic imaging of breast: Secondary | ICD-10-CM

## 2024-05-17 ENCOUNTER — Telehealth (INDEPENDENT_AMBULATORY_CARE_PROVIDER_SITE_OTHER): Payer: Self-pay

## 2024-05-17 ENCOUNTER — Encounter (INDEPENDENT_AMBULATORY_CARE_PROVIDER_SITE_OTHER): Payer: Self-pay | Admitting: Gastroenterology

## 2024-05-17 ENCOUNTER — Ambulatory Visit (INDEPENDENT_AMBULATORY_CARE_PROVIDER_SITE_OTHER): Admitting: Gastroenterology

## 2024-05-17 VITALS — BP 126/70 | HR 61 | Temp 98.4°F | Ht 66.0 in | Wt 242.6 lb

## 2024-05-17 DIAGNOSIS — R195 Other fecal abnormalities: Secondary | ICD-10-CM | POA: Diagnosis not present

## 2024-05-17 DIAGNOSIS — Z6839 Body mass index (BMI) 39.0-39.9, adult: Secondary | ICD-10-CM | POA: Insufficient documentation

## 2024-05-17 DIAGNOSIS — K760 Fatty (change of) liver, not elsewhere classified: Secondary | ICD-10-CM | POA: Insufficient documentation

## 2024-05-17 DIAGNOSIS — R748 Abnormal levels of other serum enzymes: Secondary | ICD-10-CM | POA: Diagnosis not present

## 2024-05-17 MED ORDER — PEG 3350-KCL-NA BICARB-NACL 420 G PO SOLR: 4000.0000 mL | Freq: Once | ORAL | 0 refills | Status: AC

## 2024-05-17 NOTE — Telephone Encounter (Signed)
 Spoke with patient in the office, scheduled colonoscopy for 06/23/2024 at 8:30am. Rx sent to pharmacy. Instructions sent to mychart.

## 2024-05-17 NOTE — Patient Instructions (Signed)
 It was very nice to meet you today, as dicussed with will plan for the following :  1) labwork and ultrasound  2) Colonoscopy

## 2024-05-17 NOTE — Telephone Encounter (Signed)
 PA for TCS on [employer]: Prior Auth is not required.

## 2024-05-17 NOTE — Progress Notes (Signed)
 Jeaneen Cala Faizan Keili Hasten , M.D. Gastroenterology & Hepatology [facility] [address] [place], [place] [address] Primary Care Physician: Bluford Jacqulyn MATSU, DO [address] [place] [address]  Chief Complaint: Positive Cologuard, chronically elevated alk phos  History of Present Illness: Dana Hardy is a 53 y.o. 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 Patient denies any active GI complaints.  She will have regular bowel movements daily without any straining or blood in stool The patient denies having any nausea, vomiting, fever, chills, hematochezia, melena, hematemesis, abdominal distention, abdominal pain, diarrhea, jaundice, pruritus or weight loss.  Patient denies fatigue generalized itching or yellowing of the skin  Last ZHI:ZMRE after cholecystectomy previously  Last Colonoscopy:none  FHx: neg for any gastrointestinal/liver disease, no malignancies Social: neg smoking, alcohol or illicit drug use Surgical: Cholecystectomy   Past Medical History:HTN  Past Surgical History: Past Surgical History:  Procedure Laterality Date   CHOLECYSTECTOMY      Family History:History reviewed. No pertinent family history.  Social History: Social History   Tobacco Use  Smoking Status Never  Smokeless Tobacco Never   Social History   Substance and Sexual Activity  Alcohol Use No   Social History   Substance and Sexual Activity  Drug Use No    Allergies: No Known Allergies  Medications: Current Outpatient Medications  Medication Sig Dispense Refill   chlorthalidone  (HYGROTON ) 25 MG tablet Take 1 tablet (25 mg total) by mouth daily. Appointment required for future refills 90 tablet 3    enalapril  (VASOTEC ) 20 MG tablet Take 1 tablet (20 mg total) by mouth daily. Appointment required 90 tablet 3   No current facility-administered medications for this visit.    Review of Systems: GENERAL: negative for malaise, night sweats HEENT: No changes in hearing or vision, no nose bleeds or other nasal problems. NECK: Negative for lumps, goiter, pain and significant neck swelling RESPIRATORY: Negative for cough, wheezing CARDIOVASCULAR: Negative for chest pain, leg swelling, palpitations, orthopnea GI: SEE HPI MUSCULOSKELETAL: Negative for joint pain or swelling, back pain, and muscle pain. SKIN: Negative for lesions, rash HEMATOLOGY Negative for prolonged bleeding, bruising easily, and swollen nodes. ENDOCRINE: Negative for cold or heat intolerance, polyuria, polydipsia and goiter. NEURO: negative for tremor, gait imbalance, syncope and seizures. The remainder of the review of systems is noncontributory.   Physical Exam: BP 126/70   Pulse 61   Temp 98.4 F (36.9 C)   Ht 5' 6 (1.676 m)   Wt 242 lb 9.6 oz (110 kg)   LMP 02/24/2024 (Approximate)   BMI 39.16 kg/m  GENERAL: The patient is AO x3, in no acute distress. HEENT: Head is normocephalic and atraumatic. EOMI are intact. Mouth is well hydrated and without lesions. NECK: Supple. No masses LUNGS: Clear to auscultation. No presence of rhonchi/wheezing/rales. Adequate chest expansion HEART: RRR, normal s1 and s2. ABDOMEN: Soft, nontender, no guarding, no peritoneal signs, and nondistended. BS +. No masses.  Imaging/Labs: as above     Latest Ref Rng & Units 04/07/2024   10:25 AM 03/11/2023   10:48 AM 08/28/2021    9:11 AM  CBC  WBC 3.4 - 10.8 x10E3/uL 6.7  6.2  7.4   Hemoglobin 11.1 - 15.9 g/dL 85.9  85.3  84.1   Hematocrit 34.0 - 46.6 %  43.4  42.8  47.8   Platelets 150 - 450 x10E3/uL 281  227  279    No results found for: IRON, TIBC, FERRITIN  I personally reviewed and interpreted the available labs,  imaging and endoscopic files.   LIVER, RIGHT LOBE LESION, BIOPSY:   - BENIGN INFLAMED LIVER PARENCHYMA.   - MALIGNANT FEATURES ARE NOT IDENTIFIED.   - SEE COMMENT.    COMMENT   The specimen consists of banal appearing hepatic parenchyma with   scattered foci of mixed inflammation and sinusoidal dilatation.   There is an area lacking bile ducts, which likely represents the   targeted lesion. 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 Clinical and radiologic correlation is   strongly recommended.   Impression and Plan:  Dana Hardy is a 53 y.o. female with hypertension who presents for evaluation of Positive Cologuard, chronically elevated alk phos  #Cologuard test positive   Patient does not have any high risk factors for colorectal cancer malignancy.  She has been asymptomatic. Discussed cologuard  results in detail, specifically what it means when the test is positive or negative.  Discussed that there is a possibility that even when the test is positive there may not be a polyp found on colonoscopy. More than 50% of the office visit was dedicated to discussing the procedure, including the day of and risks involved. Patient understands what the procedure involves including the benefits and any risks. Patient understands alternatives to the proposed procedure. Risks including (but not limited to) bleeding, tearing of the lining (perforation), rupture of adjacent organs, problems with heart and lung function, infection, and medication reactions. A small percentage of complications may require surgery, hospitalization, repeat endoscopic procedure, and/or transfusion. A small percentage of polyps and other tumors may not be seen.  - Schedule colonoscopy  #Chronically elevated ALP  #Liver lesion   Patient has elevated alkaline phosphatase at least since 2014  Alkaline phos : liver fraction : 65%   which corresponds to level of 140 (elevated)  GGT: 50   Will obtain antimitochondrial antibody, baseline viral hepatitis profile  Patient was previously worked up for liver lesion in 2010 and had liver biopsy deemed to be focal nodular hyperplasia or hepatocellular adenoma.   Will obtain baseline ultrasound  #MASLD  #BMI : 39   Risk factors for MASLD  include BMI 39 and HTN   Recommendation :     - walking at a brisk pace/biking at moderate intesity 2.5-5 hours per week     - use pedometer/step counter to track activity     - goal to lose 5-10% of initial body weight     - avoid suagry drinks and juices, use zero calorie beverages     - increase water intake     - eat a low carb diet with plenty of veggies and fruit     - Get sufficient sleep 7-8 hrs nightly     - maitain active lifestyle     - avoid alcohol     - recommend 2-3 cups Coffee daily     - Counsel on lowering cholesterol by having a diet rich in vegetables,          protein (avoid red meats) and good fats(fish, salmon).    All questions were answered.      Jakeim Sedore Faizan Donelle Baba, MD Gastroenterology and Hepatology [facility]   This chart has been completed  using [employer], and while attempts have been made to ensure accuracy , certain words and phrases may not be transcribed as intended

## 2024-05-22 LAB — PROTIME-INR
INR: 1 (ref 0.9–1.2)
Prothrombin Time: 10.8 s (ref 9.1–12.0)

## 2024-05-22 LAB — HEPATITIS A ANTIBODY, TOTAL: hep A Total Ab: NEGATIVE

## 2024-05-22 LAB — HEPATITIS C ANTIBODY: Hep C Virus Ab: NONREACTIVE

## 2024-05-22 LAB — ANA: ANA Titer 1: NEGATIVE

## 2024-05-22 LAB — HEPATITIS B SURFACE ANTIGEN: Hepatitis B Surface Ag: NEGATIVE

## 2024-05-22 LAB — IRON,TIBC AND FERRITIN PANEL
Ferritin: 146 ng/mL (ref 15–150)
Iron Saturation: 20 % (ref 15–55)
Iron: 49 ug/dL (ref 27–159)
Total Iron Binding Capacity: 249 ug/dL — ABNORMAL LOW (ref 250–450)
UIBC: 200 ug/dL (ref 131–425)

## 2024-05-22 LAB — MITOCHONDRIAL ANTIBODIES: Mitochondrial Ab: <20 U (ref 0.0–20.0)

## 2024-05-22 LAB — HEPATITIS B SURFACE ANTIBODY,QUALITATIVE: Hep B Surface Ab, Qual: NONREACTIVE

## 2024-05-22 LAB — ANTI-SMOOTH MUSCLE ANTIBODY, IGG: Smooth Muscle Ab: 9 U (ref 0–19)

## 2024-05-22 LAB — HIV ANTIBODY (ROUTINE TESTING W REFLEX): HIV Screen 4th Generation wRfx: NONREACTIVE

## 2024-05-22 LAB — HEPATITIS B CORE ANTIBODY, TOTAL: Hep B Core Total Ab: NEGATIVE

## 2024-05-23 ENCOUNTER — Encounter (HOSPITAL_COMMUNITY)

## 2024-05-27 ENCOUNTER — Ambulatory Visit (INDEPENDENT_AMBULATORY_CARE_PROVIDER_SITE_OTHER): Payer: Self-pay | Admitting: Gastroenterology

## 2024-05-30 ENCOUNTER — Ambulatory Visit (HOSPITAL_COMMUNITY)
Admission: RE | Admit: 2024-05-30 | Discharge: 2024-05-30 | Disposition: A | Source: Ambulatory Visit | Attending: Family Medicine | Admitting: Family Medicine

## 2024-05-30 ENCOUNTER — Encounter (HOSPITAL_COMMUNITY): Payer: Self-pay

## 2024-05-30 ENCOUNTER — Other Ambulatory Visit (HOSPITAL_COMMUNITY): Payer: Self-pay | Admitting: Family Medicine

## 2024-05-30 DIAGNOSIS — R928 Other abnormal and inconclusive findings on diagnostic imaging of breast: Secondary | ICD-10-CM

## 2024-05-31 ENCOUNTER — Other Ambulatory Visit (HOSPITAL_COMMUNITY)

## 2024-06-07 ENCOUNTER — Ambulatory Visit (HOSPITAL_COMMUNITY): Admission: RE | Admit: 2024-06-07 | Source: Ambulatory Visit

## 2024-06-12 ENCOUNTER — Other Ambulatory Visit (HOSPITAL_COMMUNITY): Payer: Self-pay | Admitting: Family Medicine

## 2024-06-12 DIAGNOSIS — R928 Other abnormal and inconclusive findings on diagnostic imaging of breast: Secondary | ICD-10-CM

## 2024-06-13 ENCOUNTER — Encounter (HOSPITAL_COMMUNITY): Payer: Self-pay

## 2024-06-13 ENCOUNTER — Ambulatory Visit (HOSPITAL_COMMUNITY): Admission: RE | Admit: 2024-06-13 | Discharge: 2024-06-13 | Attending: Family Medicine | Admitting: Family Medicine

## 2024-06-13 ENCOUNTER — Ambulatory Visit (HOSPITAL_COMMUNITY): Admission: RE | Admit: 2024-06-13 | Discharge: 2024-06-13 | Attending: Gastroenterology

## 2024-06-13 ENCOUNTER — Inpatient Hospital Stay (HOSPITAL_COMMUNITY): Admission: RE | Admit: 2024-06-13 | Discharge: 2024-06-13 | Attending: Family Medicine | Admitting: Family Medicine

## 2024-06-13 DIAGNOSIS — R928 Other abnormal and inconclusive findings on diagnostic imaging of breast: Secondary | ICD-10-CM | POA: Insufficient documentation

## 2024-06-13 DIAGNOSIS — R748 Abnormal levels of other serum enzymes: Secondary | ICD-10-CM | POA: Insufficient documentation

## 2024-06-13 DIAGNOSIS — K760 Fatty (change of) liver, not elsewhere classified: Secondary | ICD-10-CM | POA: Diagnosis present

## 2024-06-13 HISTORY — PX: BREAST BIOPSY: SHX20

## 2024-06-13 MED ORDER — LIDOCAINE-EPINEPHRINE (PF) 1 %-1:200000 IJ SOLN
10.0000 mL | Freq: Once | INTRAMUSCULAR | Status: AC
  Administered 2024-06-13: 13:00:00 10 mL via INTRADERMAL

## 2024-06-13 MED ORDER — LIDOCAINE HCL (PF) 2 % IJ SOLN
10.0000 mL | Freq: Once | INTRAMUSCULAR | Status: AC
  Administered 2024-06-13: 13:00:00 10 mL

## 2024-06-13 MED ORDER — LIDOCAINE HCL (PF) 2 % IJ SOLN
INTRAMUSCULAR | Status: AC
  Filled 2024-06-13: qty 10

## 2024-06-13 MED ORDER — LIDOCAINE-EPINEPHRINE (PF) 1 %-1:200000 IJ SOLN
INTRAMUSCULAR | Status: AC
  Filled 2024-06-13: qty 30

## 2024-06-13 NOTE — Progress Notes (Signed)
 PT tolerated Left breast biopsy well today with NAD noted. PT verbalized understanding of discharge instructions and given an ice pack. PT ambulated back to the mammogram area at this time. Specimens taken to lab by Lexi from ultrasound.

## 2024-06-14 LAB — SURGICAL PATHOLOGY

## 2024-06-16 NOTE — Progress Notes (Signed)
 Ultrasound  IMPRESSION: Diffuse increased echogenicity of the hepatic parenchyma is a nonspecific indicator of hepatocellular dysfunction, most commonly steatosis.

## 2024-06-19 ENCOUNTER — Other Ambulatory Visit: Payer: Self-pay

## 2024-06-19 ENCOUNTER — Encounter (HOSPITAL_COMMUNITY)
Admission: RE | Admit: 2024-06-19 | Discharge: 2024-06-19 | Disposition: A | Discharge status: Home / Self Care | Source: Ambulatory Visit | Attending: Gastroenterology | Admitting: Gastroenterology

## 2024-06-19 ENCOUNTER — Encounter (HOSPITAL_COMMUNITY): Payer: Self-pay

## 2024-06-19 VITALS — Ht 67.0 in | Wt 237.0 lb

## 2024-06-19 DIAGNOSIS — Z01818 Encounter for other preprocedural examination: Secondary | ICD-10-CM

## 2024-06-19 HISTORY — DX: Hemangioma unspecified site: D18.00

## 2024-06-23 ENCOUNTER — Encounter (HOSPITAL_COMMUNITY): Payer: Self-pay | Admitting: Gastroenterology

## 2024-06-23 ENCOUNTER — Other Ambulatory Visit: Payer: Self-pay

## 2024-06-23 ENCOUNTER — Ambulatory Visit (HOSPITAL_COMMUNITY): Payer: Self-pay | Admitting: Anesthesiology

## 2024-06-23 ENCOUNTER — Ambulatory Visit (HOSPITAL_COMMUNITY)
Admission: RE | Admit: 2024-06-23 | Discharge: 2024-06-23 | Disposition: A | Discharge status: Home / Self Care | Attending: Gastroenterology | Admitting: Gastroenterology

## 2024-06-23 ENCOUNTER — Encounter (HOSPITAL_COMMUNITY)
Admission: RE | Disposition: A | Discharge status: Home / Self Care | Payer: Self-pay | Source: Home / Self Care | Attending: Gastroenterology

## 2024-06-23 DIAGNOSIS — R195 Other fecal abnormalities: Secondary | ICD-10-CM | POA: Insufficient documentation

## 2024-06-23 DIAGNOSIS — E66813 Obesity, class 3: Secondary | ICD-10-CM | POA: Insufficient documentation

## 2024-06-23 DIAGNOSIS — K648 Other hemorrhoids: Secondary | ICD-10-CM | POA: Insufficient documentation

## 2024-06-23 DIAGNOSIS — D124 Benign neoplasm of descending colon: Secondary | ICD-10-CM | POA: Insufficient documentation

## 2024-06-23 DIAGNOSIS — Z6836 Body mass index (BMI) 36.0-36.9, adult: Secondary | ICD-10-CM | POA: Insufficient documentation

## 2024-06-23 DIAGNOSIS — D125 Benign neoplasm of sigmoid colon: Secondary | ICD-10-CM | POA: Insufficient documentation

## 2024-06-23 DIAGNOSIS — Z1211 Encounter for screening for malignant neoplasm of colon: Secondary | ICD-10-CM | POA: Insufficient documentation

## 2024-06-23 DIAGNOSIS — I10 Essential (primary) hypertension: Secondary | ICD-10-CM | POA: Diagnosis not present

## 2024-06-23 DIAGNOSIS — D122 Benign neoplasm of ascending colon: Secondary | ICD-10-CM | POA: Insufficient documentation

## 2024-06-23 DIAGNOSIS — E782 Mixed hyperlipidemia: Secondary | ICD-10-CM | POA: Diagnosis not present

## 2024-06-23 DIAGNOSIS — K621 Rectal polyp: Secondary | ICD-10-CM | POA: Diagnosis not present

## 2024-06-23 DIAGNOSIS — D128 Benign neoplasm of rectum: Secondary | ICD-10-CM | POA: Diagnosis not present

## 2024-06-23 DIAGNOSIS — K5730 Diverticulosis of large intestine without perforation or abscess without bleeding: Secondary | ICD-10-CM | POA: Insufficient documentation

## 2024-06-23 DIAGNOSIS — K635 Polyp of colon: Secondary | ICD-10-CM | POA: Diagnosis not present

## 2024-06-23 DIAGNOSIS — D123 Benign neoplasm of transverse colon: Secondary | ICD-10-CM

## 2024-06-23 HISTORY — PX: COLONOSCOPY: SHX5424

## 2024-06-23 HISTORY — PX: POLYPECTOMY: SHX149

## 2024-06-23 LAB — HM COLONOSCOPY

## 2024-06-23 SURGERY — COLONOSCOPY
Anesthesia: Monitor Anesthesia Care

## 2024-06-23 MED ORDER — PROPOFOL 10 MG/ML IV BOLUS
INTRAVENOUS | Status: DC | PRN
  Administered 2024-06-23: 100 mg via INTRAVENOUS
  Administered 2024-06-23: 175 ug/kg/min via INTRAVENOUS

## 2024-06-23 MED ORDER — LACTATED RINGERS IV SOLN: INTRAVENOUS | Status: DC | PRN

## 2024-06-23 MED ORDER — LIDOCAINE HCL (CARDIAC) PF 100 MG/5ML IV SOSY
PREFILLED_SYRINGE | INTRAVENOUS | Status: DC | PRN
  Administered 2024-06-23: 50 mg via INTRAVENOUS

## 2024-06-23 NOTE — Discharge Instructions (Signed)

## 2024-06-23 NOTE — Op Note (Signed)
 [facility] Patient Name: Dana Hardy Procedure Date: 06/23/2024 8:12 AM MRN: [id number] Date of Birth: [birth year] Attending MD: Deatrice Dine , MD, [id number] CSN: [id number] Age: 53 Admit Type: Outpatient Procedure:                Colonoscopy Indications:              Positive Cologuard test Providers:                Deatrice Dine, MD, Devere Lodge, Bascom Blush Referring MD:              Medicines:                Monitored Anesthesia Care Complications:            No immediate complications. Estimated Blood Loss:     Estimated blood loss was minimal. Procedure:                Pre-Anesthesia Assessment:                           - Prior to the procedure, a History and Physical                            was performed, and patient medications and                            allergies were reviewed. The patient's tolerance of                            previous anesthesia was also reviewed. The risks                            and benefits of the procedure and the sedation                            options and risks were discussed with the patient.                            All questions were answered, and informed consent                            was obtained. Prior Anticoagulants: The patient has                            taken no anticoagulant or antiplatelet agents                            except for aspirin. ASA Grade Assessment: II - A                            patient with mild systemic disease. After reviewing                            the risks and benefits, the patient was deemed in  satisfactory condition to undergo the procedure.                           After obtaining informed consent, the colonoscope                            was passed under direct vision. Throughout the                            procedure, the patient's blood pressure, pulse, and                            oxygen saturations were monitored continuously.  The                            CF-[id number] ([id number]) Colon was introduced through                            the anus and advanced to the the cecum, identified                            by appendiceal orifice and ileocecal valve. The                            colonoscopy was performed without difficulty. The                            patient tolerated the procedure well. The quality                            of the bowel preparation was evaluated using the                            BBPS [facility] Bowel Preparation Scale) with scores                            of: Right Colon = 3, Transverse Colon = 3 and Left                            Colon = 3 (entire mucosa seen well with no residual                            staining, small fragments of stool or opaque                            liquid). The total BBPS score equals 9. The                            ileocecal valve, appendiceal orifice, and rectum                            were photographed. Scope In: 8:29:42 AM Scope Out: 8:58:58 AM Scope Withdrawal Time: 0 hours 26 minutes  44 seconds  Total Procedure Duration: 0 hours 29 minutes 16 seconds  Findings:      The perianal and digital rectal examinations were normal.      Five sessile polyps were found in the rectum, sigmoid colon, descending       colon and ascending colon. The polyps were 4 to 8 mm in size. These       polyps were removed with a cold snare. Resection and retrieval were       complete.      A 10 mm polyp was found in the descending colon. The polyp was flat. The       polyp was removed with a cold snare. Resection and retrieval were       complete.      A few small-mouthed diverticula were found in the left colon.      Non-bleeding internal hemorrhoids were found during retroflexion. The       hemorrhoids were small. Impression:               - Five 4 to 8 mm polyps in the rectum, in the                            sigmoid colon, in the descending colon and in the                             ascending colon, removed with a cold snare.                            Resected and retrieved.                           - One 10 mm polyp in the descending colon, removed                            with a cold snare. Resected and retrieved.                           - Diverticulosis in the left colon.                           - Non-bleeding internal hemorrhoids. Moderate Sedation:      Per Anesthesia Care Recommendation:           - Patient has a contact number available for                            emergencies. The signs and symptoms of potential                            delayed complications were discussed with the                            patient. Return to normal activities tomorrow.                            Written discharge instructions were provided to the  patient.                           - Resume previous diet.                           - Continue present medications.                           - Await pathology results.                           - Repeat colonoscopy in 3 years for surveillance                            based on pathology results.                           - Return to GI clinic as previously scheduled. Procedure Code(s):        --- Professional ---                           [phone], Colonoscopy, flexible; with removal of                            tumor(s), polyp(s), or other lesion(s) by snare                            technique Diagnosis Code(s):        --- Professional ---                           D12.8, Benign neoplasm of rectum                           D12.5, Benign neoplasm of sigmoid colon                           D12.4, Benign neoplasm of descending colon                           D12.2, Benign neoplasm of ascending colon                           K64.8, Other hemorrhoids                           R19.5, Other fecal abnormalities                           K57.30, Diverticulosis of  large intestine without                            perforation or abscess without bleeding CPT copyright 2022 American Medical Association. All rights reserved. The codes documented in this report are preliminary and upon coder review may  be revised to meet current compliance requirements. Deatrice Dine, MD Deatrice Dine, MD 06/23/2024 9:04:35 AM This report  has been signed electronically. Number of Addenda: 0

## 2024-06-23 NOTE — H&P (Signed)
 Primary Care Physician:  Bluford Jacqulyn MATSU, DO Primary Gastroenterologist:  Dr. Cinderella  Pre-Procedure History & Physical: HPI:  Dana Hardy is a 53 y.o. female with hypertension who presents for evaluation of Positive Cologuard,   Past Medical History:  Diagnosis Date   Hemangioma    Fatty liver    Past Surgical History:  Procedure Laterality Date   ADENOIDECTOMY     BREAST BIOPSY Left 06/13/2024   US  LT BREAST BX W LOC DEV 1ST LESION IMG BX SPEC US  GUIDE 06/13/2024 Mir, Aliene SAUNDERS, MD [facility]   CHOLECYSTECTOMY      Prior to Admission medications  Medication Sig Start Date End Date Taking? Authorizing Provider  chlorthalidone  (HYGROTON ) 25 MG tablet Take 1 tablet (25 mg total) by mouth daily. Appointment required for future refills 04/09/24  Yes Cook, Jayce G, DO  enalapril  (VASOTEC ) 20 MG tablet Take 1 tablet (20 mg total) by mouth daily. Appointment required 04/09/24  Yes Cook, Jayce G, DO    Allergies as of 05/17/2024   (No Known Allergies)    History reviewed. No pertinent family history.  Social History   Socioeconomic History   Marital status: Married    Spouse name: Not on file   Number of children: Not on file   Years of education: Not on file   Highest education level: Not on file  Occupational History   Not on file  Tobacco Use   Smoking status: Never   Smokeless tobacco: Never  Vaping Use   Vaping status: Never Used  Substance and Sexual Activity   Alcohol use: No   Drug use: No   Sexual activity: Yes    Birth control/protection: Pill  Other Topics Concern   Not on file  Social History Narrative   Not on file   Social Drivers of Health   Tobacco Use: Low Risk (06/23/2024)   Patient History    Smoking Tobacco Use: Never    Smokeless Tobacco Use: Never    Passive Exposure: Not on file  Financial Resource Strain: Not on file  Food Insecurity: Not on file  Transportation Needs: Not on file  Physical Activity: Not on file  Stress: Not on  file  Social Connections: Not on file  Intimate Partner Violence: Not on file  Depression (PHQ2-9): Low Risk (05/04/2024)   Depression (PHQ2-9)    PHQ-2 Score: 0  Alcohol Screen: Not on file  Housing: Not on file  Utilities: Not on file  Health Literacy: Not on file    Review of Systems: See HPI, otherwise negative ROS  Physical Exam: Vital signs in last 24 hours: Temp:  [98.8 F (37.1 C)] 98.8 F (37.1 C) (12/26 0742) Pulse Rate:  [63] 63 (12/26 0742) Resp:  [13] 13 (12/26 0742) BP: (137)/(84) 137/84 (12/26 0742) SpO2:  [99 %] 99 % (12/26 0742) Weight:  [105.6 kg] 105.6 kg (12/26 0742)   General:   Alert,  Well-developed, well-nourished, pleasant and cooperative in NAD Head:  Normocephalic and atraumatic. Eyes:  Sclera clear, no icterus.   Conjunctiva pink. Ears:  Normal auditory acuity. Nose:  No deformity, discharge,  or lesions. Msk:  Symmetrical without gross deformities. Normal posture. Extremities:  Without clubbing or edema. Neurologic:  Alert and  oriented x4;  grossly normal neurologically. Skin:  Intact without significant lesions or rashes. Psych:  Alert and cooperative. Normal mood and affect.  Impression/Plan: Proceed with colonoscopy   The risks of the procedure including infection, bleed, or perforation as well as benefits, limitations,  alternatives and imponderables have been reviewed with the patient. Questions have been answered. All parties agreeable.

## 2024-06-23 NOTE — Anesthesia Postprocedure Evaluation (Signed)
"   Anesthesia Post Note  Patient: Dana Hardy  Procedure(s) Performed: COLONOSCOPY POLYPECTOMY, INTESTINE  Patient location during evaluation: Phase II Anesthesia Type: MAC Level of consciousness: awake Pain management: pain level controlled Vital Signs Assessment: post-procedure vital signs reviewed and stable Respiratory status: spontaneous breathing and respiratory function stable Cardiovascular status: blood pressure returned to baseline and stable Postop Assessment: no headache and no apparent nausea or vomiting Anesthetic complications: no Comments: Late entry   No notable events documented.   Last Vitals:  Vitals:   06/23/24 0907 06/23/24 0913  BP: 99/67 111/62  Pulse:    Resp:    Temp:    SpO2: 99% 100%    Last Pain:  Vitals:   06/23/24 0907  TempSrc:   PainSc: 0-No pain                 Yvonna PARAS Kamilah Correia      "

## 2024-06-23 NOTE — Transfer of Care (Signed)
 Immediate Anesthesia Transfer of Care Note  Patient: Dana Hardy  Procedure(s) Performed: COLONOSCOPY POLYPECTOMY, INTESTINE  Patient Location: Short Stay  Anesthesia Type:MAC  Level of Consciousness: drowsy, patient cooperative, and responds to stimulation  Airway & Oxygen Therapy: Patient Spontanous Breathing  Post-op Assessment: Report given to RN and Post -op Vital signs reviewed and stable  Post vital signs: Reviewed and stable  Last Vitals:  Vitals Value Taken Time  BP 114/76   Temp 98.4   Pulse 80   Resp 16   SpO2 98     Last Pain:  Vitals:   06/23/24 0822  TempSrc:   PainSc: 0-No pain      Patients Stated Pain Goal: 9 (06/23/24 0742)  Complications: No notable events documented.

## 2024-06-23 NOTE — Anesthesia Preprocedure Evaluation (Signed)
"                                    Anesthesia Evaluation  Patient identified by MRN, date of birth, ID band Patient awake    Reviewed: Allergy & Precautions, H&P , NPO status , Patient's Chart, lab work & pertinent test results, reviewed documented beta blocker date and time   Airway Mallampati: II  TM Distance: >3 FB Neck ROM: full    Dental no notable dental hx.    Pulmonary neg pulmonary ROS   Pulmonary exam normal breath sounds clear to auscultation       Cardiovascular Exercise Tolerance: Good hypertension, negative cardio ROS  Rhythm:regular Rate:Normal     Neuro/Psych negative neurological ROS  negative psych ROS   GI/Hepatic negative GI ROS, Neg liver ROS,,,  Endo/Other  negative endocrine ROS  Class 3 obesity  Renal/GU negative Renal ROS  negative genitourinary   Musculoskeletal   Abdominal   Peds  Hematology negative hematology ROS (+)   Anesthesia Other Findings   Reproductive/Obstetrics negative OB ROS                              Anesthesia Physical Anesthesia Plan  ASA: 3  Anesthesia Plan: MAC   Post-op Pain Management:    Induction:   PONV Risk Score and Plan: Propofol  infusion  Airway Management Planned:   Additional Equipment:   Intra-op Plan:   Post-operative Plan:   Informed Consent: I have reviewed the patients History and Physical, chart, labs and discussed the procedure including the risks, benefits and alternatives for the proposed anesthesia with the patient or authorized representative who has indicated his/her understanding and acceptance.     Dental Advisory Given  Plan Discussed with: CRNA  Anesthesia Plan Comments:         Anesthesia Quick Evaluation  "

## 2024-06-26 ENCOUNTER — Encounter (INDEPENDENT_AMBULATORY_CARE_PROVIDER_SITE_OTHER): Payer: Self-pay | Admitting: *Deleted

## 2024-06-26 LAB — SURGICAL PATHOLOGY

## 2024-06-27 ENCOUNTER — Encounter (HOSPITAL_COMMUNITY): Payer: Self-pay | Admitting: Gastroenterology

## 2024-06-30 ENCOUNTER — Ambulatory Visit (INDEPENDENT_AMBULATORY_CARE_PROVIDER_SITE_OTHER): Payer: Self-pay | Admitting: Gastroenterology

## 2024-06-30 NOTE — Progress Notes (Signed)
 3 yr TCS noted in recall Patient result letter mailed Patient's PCP is on EPIC

## 2024-10-06 ENCOUNTER — Ambulatory Visit: Admitting: Family Medicine
